# Patient Record
Sex: Female | Born: 1990 | Race: Asian | Hispanic: No | Marital: Single | Smoking: Current every day smoker
Health system: Southern US, Community
[De-identification: ages and names within clinical notes are randomized; demographics above are authoritative.]

## PROBLEM LIST (undated history)

## (undated) ENCOUNTER — Inpatient Hospital Stay (HOSPITAL_COMMUNITY): Payer: Self-pay

---
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
## 2012-01-22 ENCOUNTER — Encounter (HOSPITAL_BASED_OUTPATIENT_CLINIC_OR_DEPARTMENT_OTHER): Payer: Self-pay | Admitting: *Deleted

## 2012-01-22 ENCOUNTER — Emergency Department (HOSPITAL_BASED_OUTPATIENT_CLINIC_OR_DEPARTMENT_OTHER)
Admission: EM | Admit: 2012-01-22 | Discharge: 2012-01-22 | Disposition: A | Discharge status: Home / Self Care | Payer: Self-pay | Attending: Emergency Medicine | Admitting: Emergency Medicine

## 2012-01-22 DIAGNOSIS — IMO0002 Reserved for concepts with insufficient information to code with codable children: Secondary | ICD-10-CM | POA: Insufficient documentation

## 2012-01-22 DIAGNOSIS — K122 Cellulitis and abscess of mouth: Secondary | ICD-10-CM

## 2012-01-22 DIAGNOSIS — R51 Headache: Secondary | ICD-10-CM | POA: Insufficient documentation

## 2012-01-22 DIAGNOSIS — F17200 Nicotine dependence, unspecified, uncomplicated: Secondary | ICD-10-CM | POA: Insufficient documentation

## 2012-01-22 MED ORDER — IBUPROFEN 600 MG PO TABS: 600.0000 mg | ORAL_TABLET | Freq: Four times a day (QID) | ORAL | Status: DC | PRN

## 2012-01-22 MED ORDER — AMOXICILLIN-POT CLAVULANATE 875-125 MG PO TABS: 1.0000 | ORAL_TABLET | Freq: Two times a day (BID) | ORAL | Status: DC

## 2012-01-22 NOTE — ED Notes (Addendum)
Sore throat since Wednesday. Uvula inflamed.

## 2012-01-22 NOTE — ED Notes (Signed)
PA at bedside.

## 2012-01-22 NOTE — ED Provider Notes (Signed)
History     CSN: [id number]  Arrival date & time 01/22/12  1328   First MD Initiated Contact with Patient 01/22/12 1516      Chief Complaint  Patient presents with  . Sore Throat    (Consider location/radiation/quality/duration/timing/severity/associated sxs/prior treatment) HPI Comments: Patient who was recently hospitalized for psychiatric needs presents with a sore throat x 5 days.  She was released from the hospital yesterday. She reports that she has had a sore throat and painful swallowing which has worsened over the past few days.  She reports that she has had adequate PO intake.  She reports no fevers, N/V/D.  She states that her head feels congested and that she has had intermittent headaches.  She has tried gargling hot water and ibuprofen which has not alleviated her symptoms. Onset gradual, course constant with no relief of symptoms.   Patient is a 22 y.o. female presenting with pharyngitis. The history is provided by the patient.  Sore Throat Associated symptoms include congestion, headaches and a sore throat. Pertinent negatives include no abdominal pain, arthralgias, chest pain, chills, coughing, fatigue, fever, myalgias, nausea, neck pain, rash or vomiting.    History reviewed. No pertinent past medical history.  History reviewed. No pertinent past surgical history.  History reviewed. No pertinent family history.  History  Substance Use Topics  . Smoking status: Current Every Day Smoker -- 1.0 packs/day  . Smokeless tobacco: Not on file  . Alcohol Use: No    OB History    Grav Para Term Preterm Abortions TAB SAB Ect Mult Living                  Review of Systems  Constitutional: Positive for appetite change. Negative for fever, chills and fatigue.  HENT: Positive for congestion, sore throat, rhinorrhea and trouble swallowing. Negative for ear pain and neck pain.   Eyes: Negative for pain, discharge and redness.  Respiratory: Negative for cough, shortness  of breath and wheezing.   Cardiovascular: Negative for chest pain.  Gastrointestinal: Negative for nausea, vomiting, abdominal pain and diarrhea.  Genitourinary: Negative for dysuria.  Musculoskeletal: Negative for myalgias and arthralgias.  Skin: Negative for rash.  Neurological: Positive for headaches. Negative for dizziness and light-headedness.    Allergies  Review of patient's allergies indicates no known allergies.  Home Medications   Current Outpatient Rx  Name  Route  Sig  Dispense  Refill  . ALBUTEROL SULFATE HFA 108 (90 BASE) MCG/ACT IN AERS   Inhalation   Inhale 2 puffs into the lungs every 6 (six) hours as needed. For shortness of breath          . AMOXICILLIN 500 MG PO CAPS   Oral   Take 1 capsule (500 mg total) by mouth 3 (three) times daily.   21 capsule   0   . BENZOCAINE-MENTHOL 6-10 MG MT LOZG   Oral   Take 1 lozenge by mouth every 2 (two) hours as needed for pain.   30 lozenge   0   . IBUPROFEN 200 MG PO TABS   Oral   Take 400 mg by mouth once as needed. For sore throat          . PENICILLIN V POTASSIUM PO   Oral   Take 30 mLs by mouth 2 (two) times daily.             BP 130/87  Pulse 90  Temp 98.3 F (36.8 C)  Resp 20  Ht 5'  2" (1.575 m)  Wt 192 lb (87.091 kg)  BMI 35.12 kg/m2  SpO2 99%  LMP 01/04/2012  Physical Exam  Nursing note and vitals reviewed. Constitutional: She is oriented to person, place, and time. She appears well-developed and well-nourished.  HENT:  Head: Normocephalic and atraumatic. No trismus in the jaw.  Right Ear: Tympanic membrane, external ear and ear canal normal.  Left Ear: Tympanic membrane, external ear and ear canal normal.  Nose: Nose normal. No mucosal edema or rhinorrhea.  Mouth/Throat: Uvula is midline, oropharynx is clear and moist and mucous membranes are normal. Mucous membranes are not dry. Uvula swelling present. No oropharyngeal exudate, posterior oropharyngeal edema, posterior oropharyngeal  erythema or tonsillar abscesses.         Uvula erythematous, inflamed, and with exudate.   Eyes: Conjunctivae normal are normal. Pupils are equal, round, and reactive to light. Right eye exhibits no discharge. Left eye exhibits no discharge.  Neck: Normal range of motion. Neck supple.  Cardiovascular: Normal rate, regular rhythm and normal heart sounds.   Pulmonary/Chest: Effort normal and breath sounds normal. No respiratory distress. She has no wheezes. She has no rales.  Abdominal: Soft. Bowel sounds are normal. She exhibits no distension. There is no tenderness.  Lymphadenopathy:    She has cervical adenopathy.  Neurological: She is alert and oriented to person, place, and time.  Skin: Skin is warm and dry. No rash noted.  Psychiatric: She has a normal mood and affect.    ED Course  Procedures (including critical care time)   Labs Reviewed  RAPID STREP SCREEN   No results found.   1. Uvulitis    4:35 PM Patient seen and examined. Strep screen negative.   Vital signs reviewed and are as follows: Filed Vitals:   01/22/12 1335  BP: 130/87  Pulse: 90  Temp: 98.3 F (36.8 C)  Resp: 20   Patient urged to return with high fever, inability to swallow, trouble breathing or neck swelling. Patient verbalizes understanding and agrees with the plan.   MDM  No trismus or evidence of abscesses. Patient moves neck well and do not suspect deep tissue abscess and neck. Patient appears nontoxic. Will treat uvulitis with Augmentin. NSAIDs for pain.        Renne Crigler, [place] 01/22/12 [phone]

## 2012-01-22 NOTE — ED Notes (Signed)
Pt given rx x 2 for ibuprofen and augmentin with instructions for use

## 2012-01-23 NOTE — ED Provider Notes (Signed)
Medical screening examination/treatment/procedure(s) were performed by non-physician practitioner and as supervising physician I was immediately available for consultation/collaboration.   Farrie Sann, MD 01/23/12 0849 

## 2013-04-25 ENCOUNTER — Emergency Department (HOSPITAL_COMMUNITY)
Admission: EM | Admit: 2013-04-25 | Discharge: 2013-04-25 | Disposition: A | Discharge status: Home / Self Care | Payer: Medicaid Other | Attending: Emergency Medicine | Admitting: Emergency Medicine

## 2013-04-25 ENCOUNTER — Encounter (HOSPITAL_COMMUNITY): Payer: Self-pay | Admitting: Emergency Medicine

## 2013-04-25 DIAGNOSIS — F17200 Nicotine dependence, unspecified, uncomplicated: Secondary | ICD-10-CM | POA: Insufficient documentation

## 2013-04-25 DIAGNOSIS — H1130 Conjunctival hemorrhage, unspecified eye: Secondary | ICD-10-CM | POA: Insufficient documentation

## 2013-04-25 DIAGNOSIS — Z79899 Other long term (current) drug therapy: Secondary | ICD-10-CM | POA: Insufficient documentation

## 2013-04-25 DIAGNOSIS — H1132 Conjunctival hemorrhage, left eye: Secondary | ICD-10-CM

## 2013-04-25 DIAGNOSIS — Z792 Long term (current) use of antibiotics: Secondary | ICD-10-CM | POA: Insufficient documentation

## 2013-04-25 MED ORDER — TETRACAINE HCL 0.5 % OP SOLN
1.0000 [drp] | Freq: Once | OPHTHALMIC | Status: DC
  Filled 2013-04-25: qty 2

## 2013-04-25 MED ORDER — FLUORESCEIN-BENOXINATE 0.25-0.4 % OP SOLN: 1.0000 [drp] | Freq: Once | OPHTHALMIC | Status: DC

## 2013-04-25 MED ORDER — FLUORESCEIN SODIUM 1 MG OP STRP
1.0000 | ORAL_STRIP | Freq: Once | OPHTHALMIC | Status: DC
Start: 2013-04-25 — End: 2013-04-25
  Filled 2013-04-25: qty 1

## 2013-04-25 NOTE — ED Provider Notes (Signed)
Medical screening examination/treatment/procedure(s) were performed by non-physician practitioner and as supervising physician I was immediately available for consultation/collaboration.   EKG Interpretation None        Laray AngerKathleen M Geovannie Vilar, DO 04/25/13 1933

## 2013-04-25 NOTE — Discharge Instructions (Signed)
Refer to attached documents for more information.  °
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
## 2013-04-25 NOTE — ED Notes (Signed)
Pt reports left eye is red and she laughed real hard and blood tears came out. This has been for over 1 week. States has been outside a lot. Has clear drainage, reports only foggy vision in the morning.

## 2013-04-25 NOTE — ED Provider Notes (Signed)
CSN: [id number]     Arrival date & time 04/25/13  1109 History  This chart was scribed for non-physician practitioner working with Emilia BeckKaitlyn Kanani Mowbray, Domingo PulseStephen Methviin ED Scribe. This patient was seen in TR04C/TR04C and the patient's care was started at 12:30 PM.    No chief complaint on file.    The history is provided by the patient. No language interpreter was used.    HPI Comments: Synetta Failnita Plagge is a 23 y.o. female who presents to the Emergency Department complaining of red tears that happened while she was laughing, this happened a few days ago. Pt states that her vision had been "foggy" this morning that since resolved. She reports that her left eye is red and that the redness is spreading. She denies any pain.   History reviewed. No pertinent past medical history. History reviewed. No pertinent past surgical history. No family history on file. History  Substance Use Topics  . Smoking status: Current Every Day Smoker -- 1.00 packs/day  . Smokeless tobacco: Not on file  . Alcohol Use: No   OB History   Grav Para Term Preterm Abortions TAB SAB Ect Mult Living                 Review of Systems  Eyes: Positive for discharge and redness. Negative for pain.  All other systems reviewed and are negative.     Allergies  Review of patient's allergies indicates no known allergies.  Home Medications   Prior to Admission medications   Medication Sig Start Date End Date Taking? Authorizing Provider  albuterol (PROVENTIL HFA;VENTOLIN HFA) 108 (90 BASE) MCG/ACT inhaler Inhale 2 puffs into the lungs every 6 (six) hours as needed. For shortness of breath     Historical Provider, MD  amoxicillin (AMOXIL) 500 MG capsule Take 1 capsule (500 mg total) by mouth 3 (three) times daily. 12/04/11   Roxy Horsemanobert Browning, PA-C  amoxicillin-clavulanate (AUGMENTIN) 875-125 MG per tablet Take 1 tablet by mouth every 12 (twelve) hours. 01/22/12   Renne CriglerJoshua Geiple, PA-C  benzocaine-menthol (SORE THROAT LOZENGES)  6-10 MG lozenge Take 1 lozenge by mouth every 2 (two) hours as needed for pain. 10/16/11   Kiril Hippe, PA-C  ibuprofen (ADVIL,MOTRIN) 600 MG tablet Take 1 tablet (600 mg total) by mouth every 6 (six) hours as needed for pain. 01/22/12   Renne CriglerJoshua Geiple, PA-C  PENICILLIN V POTASSIUM PO Take 30 mLs by mouth 2 (two) times daily.      Historical Provider, MD   BP 141/94  Pulse 79  Temp(Src) 98.9 F (37.2 C) (Oral)  Resp 18  SpO2 100%  LMP 04/04/2013 Physical Exam  Nursing note and vitals reviewed. Constitutional: She is oriented to person, place, and time. She appears well-developed and well-nourished.  HENT:  Head: Normocephalic and atraumatic.  Eyes: EOM are normal. Pupils are equal, round, and reactive to light.  Large subconjunctival hemorrhage, noted at lateral left eye. No iris involvment.   Neck: Normal range of motion.  Pulmonary/Chest: Effort normal.  Musculoskeletal: Normal range of motion.  Neurological: She is alert and oriented to person, place, and time.    ED Course  Procedures (including critical care time) DIAGNOSTIC STUDIES: Oxygen Saturation is 100% on RA, normal by my interpretation.    COORDINATION OF CARE:   12:35 PM-Discussed treatment plan which includes left eye will heal on it own with pt at bedside and pt agreed to plan.   Labs Review Labs Reviewed - No data to display  Imaging Review No  results found.   EKG Interpretation None      MDM   Final diagnoses:  Subconjunctival hemorrhage of left eye   I personally performed the services described in this documentation, which was scribed in my presence. The recorded information has been reviewed and is accurate.   Patient has a subconjunctival hemorrhage of left eye. No visual disturbance. Patient reassured this will heal itself. Vitals stable and patient afebrile.     Emilia BeckKaitlyn Nymir Ringler, PA-C 04/25/13 1450

## 2013-05-25 IMAGING — CR DG CHEST 2V
2 series · 2 of 2 positions shown · non-contrast
Comparison: None

CLINICAL DATA: Chest tightness and shortness of breath.

CHEST - 2 VIEW

[w chest pa]
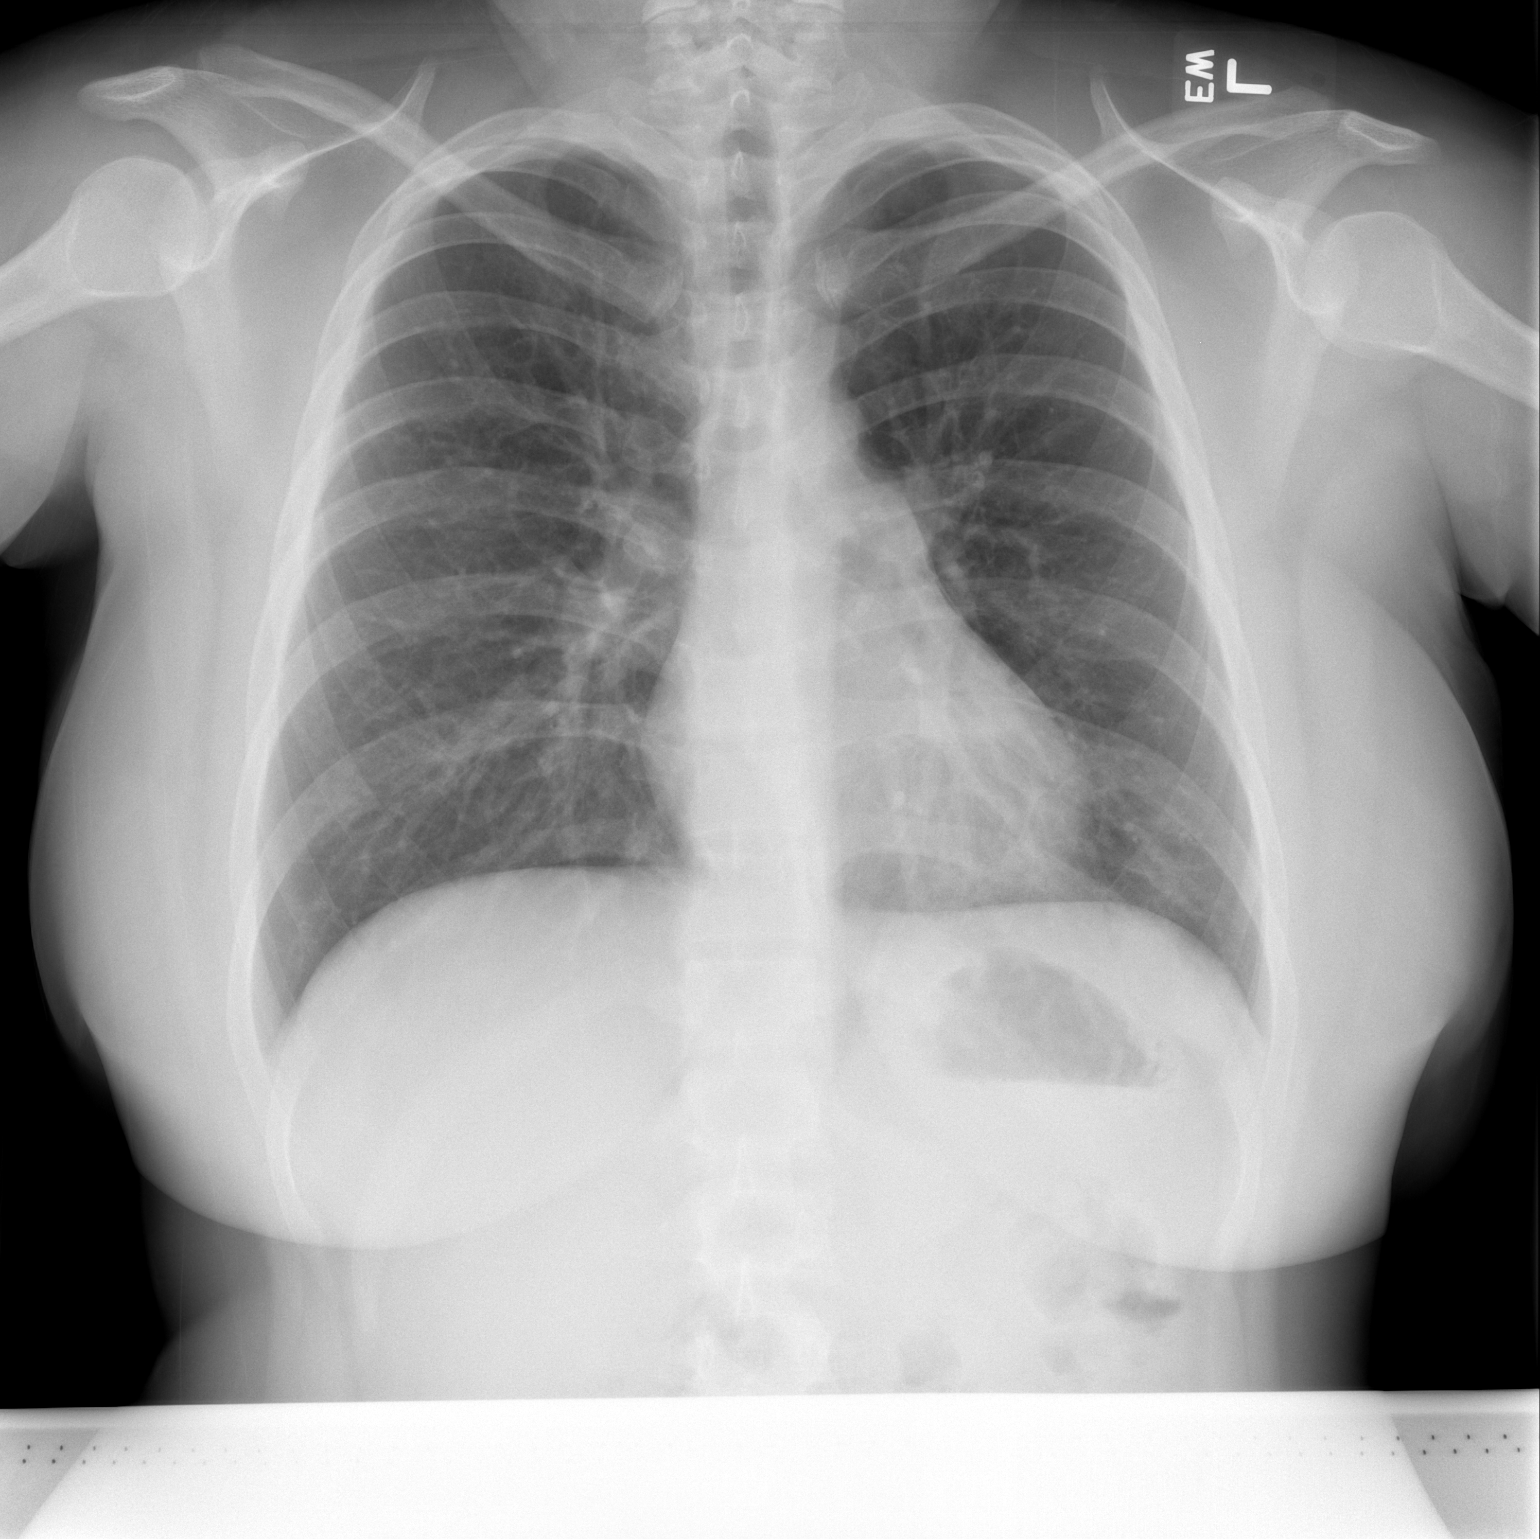

[w chest lat]
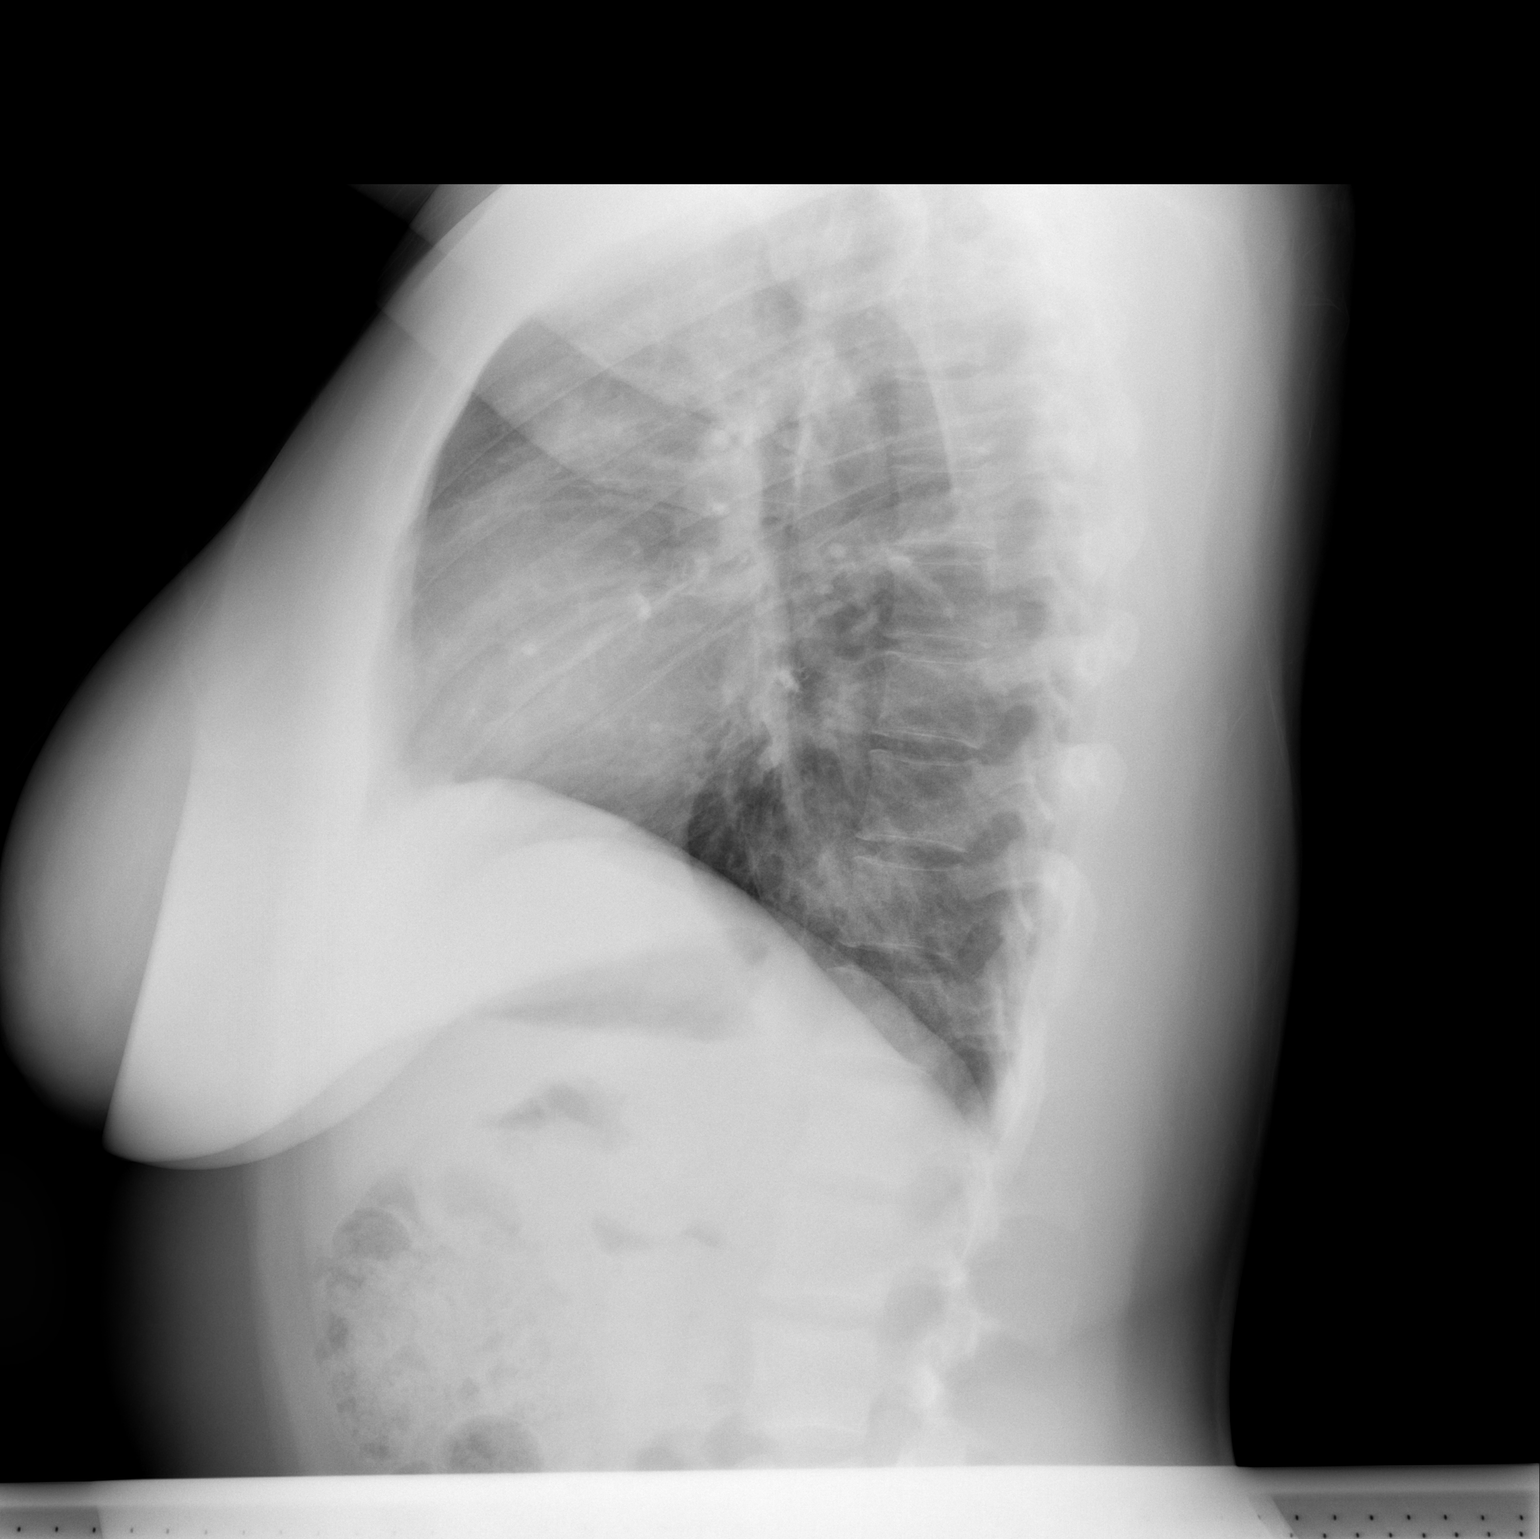

[2 of 2 positions shown; findings below may reference images not displayed]

FINDINGS: The heart size and mediastinal contours are within normal
limits.  Both lungs are clear.  The visualized skeletal structures
are unremarkable.
IMPRESSION: No active disease.

## 2013-09-24 ENCOUNTER — Encounter (HOSPITAL_COMMUNITY): Payer: Self-pay | Admitting: Emergency Medicine

## 2013-09-24 ENCOUNTER — Telehealth (HOSPITAL_BASED_OUTPATIENT_CLINIC_OR_DEPARTMENT_OTHER): Payer: Self-pay | Admitting: Emergency Medicine

## 2013-09-24 ENCOUNTER — Other Ambulatory Visit: Payer: Self-pay | Admitting: Emergency Medicine

## 2013-09-24 ENCOUNTER — Emergency Department (HOSPITAL_COMMUNITY)
Admission: EM | Admit: 2013-09-24 | Discharge: 2013-09-24 | Disposition: A | Discharge status: Home / Self Care | Payer: Medicaid Other | Attending: Emergency Medicine | Admitting: Emergency Medicine

## 2013-09-24 DIAGNOSIS — F17200 Nicotine dependence, unspecified, uncomplicated: Secondary | ICD-10-CM | POA: Insufficient documentation

## 2013-09-24 DIAGNOSIS — N61 Mastitis without abscess: Secondary | ICD-10-CM | POA: Insufficient documentation

## 2013-09-24 DIAGNOSIS — Z792 Long term (current) use of antibiotics: Secondary | ICD-10-CM | POA: Insufficient documentation

## 2013-09-24 DIAGNOSIS — Z79899 Other long term (current) drug therapy: Secondary | ICD-10-CM | POA: Insufficient documentation

## 2013-09-24 DIAGNOSIS — N644 Mastodynia: Secondary | ICD-10-CM

## 2013-09-24 DIAGNOSIS — N63 Unspecified lump in unspecified breast: Secondary | ICD-10-CM

## 2013-09-24 DIAGNOSIS — R234 Changes in skin texture: Secondary | ICD-10-CM

## 2013-09-24 MED ORDER — TRAMADOL HCL 50 MG PO TABS: 50.0000 mg | ORAL_TABLET | Freq: Four times a day (QID) | ORAL | Status: DC | PRN

## 2013-09-24 MED ORDER — CEPHALEXIN 500 MG PO CAPS: 500.0000 mg | ORAL_CAPSULE | Freq: Four times a day (QID) | ORAL | Status: DC

## 2013-09-24 MED ORDER — SULFAMETHOXAZOLE-TRIMETHOPRIM 800-160 MG PO TABS: 1.0000 | ORAL_TABLET | Freq: Two times a day (BID) | ORAL | Status: DC

## 2013-09-24 NOTE — ED Provider Notes (Signed)
CSN: [id number]     Arrival date & time 09/24/13  0903 History  This chart was scribed for non-physician practitioner, Marlon Pel, PA-C working with Audree Camel, MD by Greggory Stallion, ED scribe. This patient was seen in room TR08C/TR08C and the patient's care was started at 10:27 AM.   Chief Complaint  Patient presents with  . Breast Pain   The history is provided by the patient. No language interpreter was used.   HPI Comments: Carolyn Barajas is a 23 y.o. female who presents to the Emergency Department complaining of worsening right breast pain that started about one month ago. Palpation worsens pain. States she has a bump to her breast that is getting larger and more red. Reports some crusting around her nipple. Reports getting her nipple pierced in January. Denies fever, emesis, nipple pain.   History reviewed. No pertinent past medical history. History reviewed. No pertinent past surgical history. No family history on file. History  Substance Use Topics  . Smoking status: Current Every Day Smoker -- 1.00 packs/day  . Smokeless tobacco: Not on file  . Alcohol Use: No   OB History   Grav Para Term Preterm Abortions TAB SAB Ect Mult Living                 Review of Systems  Constitutional: Negative for fever.  Gastrointestinal: Negative for vomiting.  Musculoskeletal:       Breast pain  Skin: Positive for color change.  All other systems reviewed and are negative.  Allergies  Review of patient's allergies indicates no known allergies.  Home Medications   Prior to Admission medications   Medication Sig Start Date End Date Taking? Authorizing Provider  albuterol (PROVENTIL HFA;VENTOLIN HFA) 108 (90 BASE) MCG/ACT inhaler Inhale 2 puffs into the lungs every 6 (six) hours as needed. For shortness of breath     Historical Provider, MD  amoxicillin (AMOXIL) 500 MG capsule Take 1 capsule (500 mg total) by mouth 3 (three) times daily. 12/04/11   Roxy Horseman, PA-C   amoxicillin-clavulanate (AUGMENTIN) 875-125 MG per tablet Take 1 tablet by mouth every 12 (twelve) hours. 01/22/12   Renne Crigler, PA-C  benzocaine-menthol (SORE THROAT LOZENGES) 6-10 MG lozenge Take 1 lozenge by mouth every 2 (two) hours as needed for pain. 10/16/11   Kaitlyn Szekalski, PA-C  cephALEXin (KEFLEX) 500 MG capsule Take 1 capsule (500 mg total) by mouth 4 (four) times daily. 09/24/13   Nayquan Evinger Irine Seal, PA-C  ibuprofen (ADVIL,MOTRIN) 600 MG tablet Take 1 tablet (600 mg total) by mouth every 6 (six) hours as needed for pain. 01/22/12   Renne Crigler, PA-C  PENICILLIN V POTASSIUM PO Take 30 mLs by mouth 2 (two) times daily.      Historical Provider, MD  sulfamethoxazole-trimethoprim (SEPTRA DS) 800-160 MG per tablet Take 1 tablet by mouth 2 (two) times daily. 09/24/13   Lemarcus Baggerly Irine Seal, PA-C  traMADol (ULTRAM) 50 MG tablet Take 1 tablet (50 mg total) by mouth every 6 (six) hours as needed. 09/24/13   Cheila Wickstrom Irine Seal, PA-C   BP 117/60  Pulse 84  Temp(Src) 98.9 F (37.2 C) (Oral)  Resp 16  SpO2 99%  Physical Exam  Nursing note and vitals reviewed. Constitutional: She is oriented to person, place, and time. She appears well-developed and well-nourished. No distress.  HENT:  Head: Normocephalic and atraumatic.  Eyes: Conjunctivae and EOM are normal.  Neck: Neck supple. No tracheal deviation present.  Cardiovascular: Normal rate.   Pulmonary/Chest: Effort  normal. No respiratory distress.    Musculoskeletal: Normal range of motion.  Neurological: She is alert and oriented to person, place, and time.  Skin: Skin is warm and dry.  Psychiatric: She has a normal mood and affect. Her behavior is normal.    ED Course  Procedures (including critical care time)  DIAGNOSTIC STUDIES: Oxygen Saturation is 99% on RA, normal by my interpretation.    COORDINATION OF CARE: 10:31 AM-Discussed treatment plan which includes pain medication and an antibiotic with pt at bedside and pt agreed  to plan. Will give pt a referral to the Breast Center and advised her to follow up for ultrasound.  Labs Review Labs Reviewed - No data to display  Imaging Review No results found.   EKG Interpretation None      MDM   Final diagnoses:  Breast infection    Most likely mastoiditis but concern for possible abscess due to pain. Spoke with the breast center, order placed for ultrasound or right breast. Pt has to call as soon as she is discharged out of ER system and she can have imaging done today if she calls to schedule appointment. Pt agreeable, has the day off work and transportation to the breast center. She says she will go.  sulfamethoxazole-trimethoprim (SEPTRA DS) 800-160 MG per tablet Take 1 tablet by mouth 2 (two) times daily. 28 tablet Lawsyn Heiler Irine Seal, PA-C  traMADol (ULTRAM) 50 MG tablet Take 1 tablet (50 mg total) by mouth every 6 (six) hours as needed. 15 tablet Adline Kirshenbaum Irine Seal, PA-C cephALEXin (KEFLEX) 500 MG capsule Take 1 capsule (500 mg total) by mouth 4 (four) times daily. 40 capsule Dorthula Matas, PA-C  23 y.o.Carolyn Barajas's evaluation in the Emergency Department is complete. It has been determined that no acute conditions requiring further emergency intervention are present at this time. The patient/guardian have been advised of the diagnosis and plan. We have discussed signs and symptoms that warrant return to the ED, such as changes or worsening in symptoms.  Vital signs are stable at discharge. Filed Vitals:   09/24/13 0911  BP: 117/60  Pulse: 84  Temp: 98.9 F (37.2 C)  Resp: 16    Patient/guardian has voiced understanding and agreed to follow-up with the PCP or specialist.  I personally performed the services described in this documentation, which was scribed in my presence. The recorded information has been reviewed and is accurate.  Dorthula Matas, PA-C 09/24/13 1047

## 2013-09-24 NOTE — ED Notes (Signed)
Rt breast pain and lump x 1 month has, had nipple pierced in jan  Area is red  Tender touch

## 2013-09-24 NOTE — Discharge Instructions (Signed)
Mastitis Mastitis is inflammation of the breast tissue. It occurs most often in women who are breastfeeding, but it can also affect other women, and even sometimes men. CAUSES  Mastitis is usually caused by a bacterial infection. Bacteria enter the breast tissue through cuts or openings in the skin. Typically, this occurs with breastfeeding because of cracked or irritated skin. Sometimes, it can occur even when there is no opening in the skin. It can be associated with plugged milk (lactiferous) ducts. Nipple piercing can also lead to mastitis. Also, some forms of breast cancer can cause mastitis. SIGNS AND SYMPTOMS   Swelling, redness, tenderness, and pain in an area of the breast.  Swelling of the glands under the arm on the same side.  Fever. If an infection is allowed to progress, a collection of pus (abscess) may develop. DIAGNOSIS  Your health care provider can usually diagnose mastitis based on your symptoms and a physical exam. Tests may be done to help confirm the diagnosis. These may include:   Removal of pus from the breast by applying pressure to the area. This pus can be examined in the lab to determine which bacteria are present. If an abscess has developed, the fluid in the abscess can be removed with a needle. This can also be used to confirm the diagnosis and determine the bacteria present. In most cases, pus will not be present.  Blood tests to determine if your body is fighting a bacterial infection.  Mammogram or ultrasound tests to rule out other problems or diseases. TREATMENT  Antibiotic medicine is used to treat a bacterial infection. Your health care provider will determine which bacteria are most likely causing the infection and will select an appropriate antibiotic. This is sometimes changed based on the results of tests performed to identify the bacteria, or if there is no response to the antibiotic selected. Antibiotics are usually given by mouth. You may also be  given medicine for pain. Mastitis that occurs with breastfeeding will sometimes go away on its own, so your health care provider may choose to wait 24 hours after first seeing you to decide whether a prescription medicine is needed. HOME CARE INSTRUCTIONS   Only take over-the-counter or prescription medicines for pain, fever, or discomfort as directed by your health care provider.  If your health care provider prescribed an antibiotic, take the medicine as directed. Make sure you finish it even if you start to feel better.  Do not wear a tight or underwire bra. Wear a soft, supportive bra.  Increase your fluid intake, especially if you have a fever.  Women who are breastfeeding should follow these instructions:  Continue to empty the breast. Your health care provider can tell you whether this milk is safe for your infant or needs to be thrown out. You may be told to stop nursing until your health care provider thinks it is safe for your baby. Use a breast pump if you are advised to stop nursing.  Keep your nipples clean and dry.  Empty the first breast completely before going to the other breast. If your baby is not emptying your breasts completely for some reason, use a breast pump to empty your breasts.  If you go back to work, pump your breasts while at work to stay in time with your nursing schedule.  Avoid allowing your breasts to become overly filled with milk (engorged). SEEK MEDICAL CARE IF:   You have pus-like discharge from the breast.  Your symptoms do not   improve with the treatment prescribed by your health care provider within 2 days. SEEK IMMEDIATE MEDICAL CARE IF:   Your pain and swelling are getting worse.  You have pain that is not controlled with medicine.  You have a red line extending from the breast toward your armpit.  You have a fever or persistent symptoms for more Koskinen 2-3 days.  You have a fever and your symptoms suddenly get worse. Document Released:  12/21/2004 Document Revised: 12/26/2012 Document Reviewed: 07/21/2012 ExitCare Patient Information 2015 [employer]. This information is not intended to replace advice given to you by your health care provider. Make sure you discuss any questions you have with your health care provider.  

## 2013-09-25 NOTE — ED Provider Notes (Signed)
Medical screening examination/treatment/procedure(s) were performed by non-physician practitioner and as supervising physician I was immediately available for consultation/collaboration.     Suzi Roots, MD 09/25/13 1409

## 2013-09-27 ENCOUNTER — Other Ambulatory Visit (HOSPITAL_COMMUNITY): Payer: Self-pay | Admitting: *Deleted

## 2013-09-27 DIAGNOSIS — N6310 Unspecified lump in the right breast, unspecified quadrant: Secondary | ICD-10-CM

## 2013-10-10 ENCOUNTER — Other Ambulatory Visit: Payer: Self-pay | Admitting: Obstetrics and Gynecology

## 2013-10-16 ENCOUNTER — Ambulatory Visit (HOSPITAL_COMMUNITY): Payer: Medicaid Other

## 2013-10-16 ENCOUNTER — Other Ambulatory Visit: Payer: Medicaid Other

## 2013-10-17 ENCOUNTER — Other Ambulatory Visit: Payer: Medicaid Other

## 2013-11-28 ENCOUNTER — Encounter (HOSPITAL_COMMUNITY): Payer: Self-pay

## 2013-11-28 ENCOUNTER — Ambulatory Visit
Admission: RE | Admit: 2013-11-28 | Discharge: 2013-11-28 | Disposition: A | Discharge status: Home / Self Care | Payer: No Typology Code available for payment source | Source: Ambulatory Visit | Attending: Obstetrics and Gynecology | Admitting: Obstetrics and Gynecology

## 2013-11-28 ENCOUNTER — Ambulatory Visit (HOSPITAL_COMMUNITY)
Admission: RE | Admit: 2013-11-28 | Discharge: 2013-11-28 | Disposition: A | Discharge status: Home / Self Care | Payer: Medicaid Other | Source: Ambulatory Visit | Attending: Obstetrics and Gynecology | Admitting: Obstetrics and Gynecology

## 2013-11-28 VITALS — BP 110/64 | Ht 62.0 in | Wt 194.0 lb

## 2013-11-28 DIAGNOSIS — N6310 Unspecified lump in the right breast, unspecified quadrant: Secondary | ICD-10-CM

## 2013-11-28 DIAGNOSIS — Z1239 Encounter for other screening for malignant neoplasm of breast: Secondary | ICD-10-CM

## 2013-11-28 NOTE — Patient Instructions (Signed)
Explained Carolyn Barajas that she needs a Pap smear. Patient is on her menstrual cycle and has rescheduled Pap smear for January 10, 2013.  [facility]. Appointment scheduled for Wednesday, November 28, 2013 at 1300. Patient aware of appointment and will be there. Smoking cessation discussed with patient and resources given.  Carolyn Barajas verbalized understanding.  Imanol Bihl, Kathaleen Maserhristine Poll, RN 11:18 AM

## 2013-11-28 NOTE — Progress Notes (Signed)
Complaints of right breast lump x 2 months.   Pap Smear:  Pap smear not completed today. Last Pap smear was over 3 years ago in [facility] and normal per patient. Per patient has no history of an abnormal Pap smear. Patient is currently on menstrual cycle and has rescheduled Pap smear for Thursday, January 10, 2013 [facility]. No pap smear results in EPIC.   Physical exam: Breasts Breasts symmetrical. No skin abnormalities left breast. Two bruises observed on right breast on the right upper outer quadrant and the lower inner quadrant. No nipple retraction bilateral breasts. No nipple discharge bilateral breasts. No lymphadenopathy. No lumps palpated bilateral breasts. Unable to palpate lump in area of concern within the right breast. Patient stated was given antibiotics and is unable to palpate lump. No complaints of pain or tenderness on exam. Patient escorted to mammography for a screening mammogram. [facility] right breast ultrasound. Appointment scheduled for Wednesday, November 28, 2013 at 1300.          Pelvic/Bimanual No Pap smear completed today since patient is currently on menstrual period. Patient has rescheduled Pap smear.   Smoking cessation discussed with patient. Referred patient to the [facility] and gave information on the free smoking cessation classes offered at the [facility].

## 2014-01-10 ENCOUNTER — Encounter (HOSPITAL_COMMUNITY): Payer: Self-pay

## 2014-01-10 ENCOUNTER — Ambulatory Visit (HOSPITAL_COMMUNITY)
Admission: RE | Admit: 2014-01-10 | Discharge: 2014-01-10 | Disposition: A | Discharge status: Home / Self Care | Payer: Self-pay | Source: Ambulatory Visit | Attending: Obstetrics and Gynecology | Admitting: Obstetrics and Gynecology

## 2014-01-10 VITALS — BP 112/68 | Temp 99.1°F | Ht 62.0 in | Wt 193.4 lb

## 2014-01-10 DIAGNOSIS — Z01419 Encounter for gynecological examination (general) (routine) without abnormal findings: Secondary | ICD-10-CM

## 2014-01-10 NOTE — Progress Notes (Signed)
No complaints today.  Pap Smear:  Pap smear completed today. [facility] and normal per patient. Per patient has no history of an abnormal Pap smear. No pap smear results in EPIC.      Pelvic/Bimanual   Ext Genitalia No lesions, no swelling and no discharge observed on external genitalia.         Vagina Vagina pink and normal texture. No lesions or discharge observed in vagina.          Cervix Cervix is present. Cervix pink and of normal texture. No discharge observed.     Uterus Uterus is present and palpable. Uterus in normal position and normal size.        Adnexae Bilateral ovaries present and palpable. No tenderness on palpation.          Rectovaginal No rectal exam completed today since patient had no rectal complaints. No skin abnormalities observed on exam.      Smoking cessation discussed with patient. Ref[facility] the [facility]. Patient stated she is not read to quit at this time.

## 2014-01-10 NOTE — Patient Instructions (Signed)
Explained to  Carolyn Barajas that [facility] will cover Pap smears every 3 years unless has a history of abnormal Pap smears. Let patient know will follow up with her within the next couple weeks with results of Pap smear by phone. Smoking cessation discussed with patient and resources given. Carolyn Failnita Mcclory verbalized understanding.  Aero Drummonds, Kathaleen Maserhristine Poll, RN 2:16 PM

## 2014-01-14 ENCOUNTER — Telehealth (HOSPITAL_COMMUNITY): Payer: Self-pay | Admitting: *Deleted

## 2014-01-14 LAB — CYTOLOGY - PAP

## 2014-01-14 NOTE — Telephone Encounter (Signed)
Telephoned patient at home # and discussed negative pap smear results. Also negative HPV results. Next pap smear due in 3 years. Patient voiced understanding.

## 2014-10-08 ENCOUNTER — Emergency Department (HOSPITAL_BASED_OUTPATIENT_CLINIC_OR_DEPARTMENT_OTHER): Payer: Self-pay

## 2014-10-08 ENCOUNTER — Emergency Department (HOSPITAL_BASED_OUTPATIENT_CLINIC_OR_DEPARTMENT_OTHER)
Admission: EM | Admit: 2014-10-08 | Discharge: 2014-10-08 | Disposition: A | Discharge status: Home / Self Care | Payer: Self-pay | Attending: Emergency Medicine | Admitting: Emergency Medicine

## 2014-10-08 ENCOUNTER — Encounter (HOSPITAL_BASED_OUTPATIENT_CLINIC_OR_DEPARTMENT_OTHER): Payer: Self-pay | Admitting: Emergency Medicine

## 2014-10-08 DIAGNOSIS — Z3A00 Weeks of gestation of pregnancy not specified: Secondary | ICD-10-CM | POA: Insufficient documentation

## 2014-10-08 DIAGNOSIS — O99280 Endocrine, nutritional and metabolic diseases complicating pregnancy, unspecified trimester: Secondary | ICD-10-CM | POA: Insufficient documentation

## 2014-10-08 DIAGNOSIS — O034 Incomplete spontaneous abortion without complication: Secondary | ICD-10-CM | POA: Insufficient documentation

## 2014-10-08 DIAGNOSIS — Z792 Long term (current) use of antibiotics: Secondary | ICD-10-CM | POA: Insufficient documentation

## 2014-10-08 DIAGNOSIS — O26899 Other specified pregnancy related conditions, unspecified trimester: Secondary | ICD-10-CM

## 2014-10-08 DIAGNOSIS — O99330 Smoking (tobacco) complicating pregnancy, unspecified trimester: Secondary | ICD-10-CM | POA: Insufficient documentation

## 2014-10-08 DIAGNOSIS — O4690 Antepartum hemorrhage, unspecified, unspecified trimester: Secondary | ICD-10-CM

## 2014-10-08 DIAGNOSIS — E669 Obesity, unspecified: Secondary | ICD-10-CM | POA: Insufficient documentation

## 2014-10-08 DIAGNOSIS — R102 Pelvic and perineal pain: Secondary | ICD-10-CM

## 2014-10-08 DIAGNOSIS — Z79899 Other long term (current) drug therapy: Secondary | ICD-10-CM | POA: Insufficient documentation

## 2014-10-08 DIAGNOSIS — F17210 Nicotine dependence, cigarettes, uncomplicated: Secondary | ICD-10-CM | POA: Insufficient documentation

## 2014-10-08 LAB — CBC WITH DIFFERENTIAL/PLATELET
BASOS PCT: 0 %
Basophils Absolute: 0 10*3/uL (ref 0.0–0.1)
EOS ABS: 0.1 10*3/uL (ref 0.0–0.7)
Eosinophils Relative: 1 %
HCT: 43.9 % (ref 36.0–46.0)
HEMOGLOBIN: 14.5 g/dL (ref 12.0–15.0)
LYMPHS ABS: 2.8 10*3/uL (ref 0.7–4.0)
Lymphocytes Relative: 24 %
MCH: 31.3 pg (ref 26.0–34.0)
MCHC: 33 g/dL (ref 30.0–36.0)
MCV: 94.6 fL (ref 78.0–100.0)
MONO ABS: 0.8 10*3/uL (ref 0.1–1.0)
MONOS PCT: 7 %
NEUTROS ABS: 8 10*3/uL — AB (ref 1.7–7.7)
NEUTROS PCT: 68 %
Platelets: 346 10*3/uL (ref 150–400)
RBC: 4.64 MIL/uL (ref 3.87–5.11)
RDW: 12.8 % (ref 11.5–15.5)
WBC: 11.7 10*3/uL — ABNORMAL HIGH (ref 4.0–10.5)

## 2014-10-08 LAB — URINALYSIS, ROUTINE W REFLEX MICROSCOPIC
Bilirubin Urine: NEGATIVE
GLUCOSE, UA: NEGATIVE mg/dL
KETONES UR: NEGATIVE mg/dL
Nitrite: NEGATIVE
PROTEIN: NEGATIVE mg/dL
Specific Gravity, Urine: 1.019 (ref 1.005–1.030)
Urobilinogen, UA: 1 mg/dL (ref 0.0–1.0)
pH: 7 (ref 5.0–8.0)

## 2014-10-08 LAB — URINE MICROSCOPIC-ADD ON

## 2014-10-08 LAB — ABO/RH: ABO/RH(D): A POS

## 2014-10-08 LAB — WET PREP, GENITAL
Trich, Wet Prep: NONE SEEN
YEAST WET PREP: NONE SEEN

## 2014-10-08 LAB — PREGNANCY, URINE: Preg Test, Ur: POSITIVE — AB

## 2014-10-08 LAB — HCG, QUANTITATIVE, PREGNANCY: hCG, Beta Chain, Quant, S: 63 m[IU]/mL — ABNORMAL HIGH (ref <5)

## 2014-10-08 MED ORDER — ACETAMINOPHEN 500 MG PO TABS: 500.0000 mg | ORAL_TABLET | Freq: Four times a day (QID) | ORAL | Status: DC | PRN

## 2014-10-08 MED ORDER — ACETAMINOPHEN 325 MG PO TABS
650.0000 mg | ORAL_TABLET | Freq: Once | ORAL | Status: AC
  Administered 2014-10-08: 650 mg via ORAL
  Filled 2014-10-08: qty 2

## 2014-10-08 NOTE — ED Notes (Signed)
Patient reports that she has had a period for the last month. The patient reports that her right pelvic area and down into her right leg hurts especial after long stretches of work where she stands a lot on her feet

## 2014-10-08 NOTE — ED Provider Notes (Signed)
CSN: [id number]     Arrival date & time 10/08/14  1351 History   First MD Initiated Contact with Patient 10/08/14 1402     Chief Complaint  Patient presents with  . Pelvic Pain     (Consider location/radiation/quality/duration/timing/severity/associated sxs/prior Treatment) HPI   24 year old female who presents for evaluation of pelvic pain. Patient states she has never been pregnant before. For the past month she has had recurrent vaginal spotting and vaginal bleeding. Describes small amount of vaginal spotting which has since increased for the past 2-3 days. She also complaining of pain to her right pelvic region in which she described as a dull achy sensation waxing waning worsening with standing, occasionally radiates down to her right leg and improves with rest. She also report feeling fatigue. The pelvic pain has been increasing in severity within the past 2-3 days. This is the first day that she has off from work to be seen.  History reviewed. No pertinent past medical history. History reviewed. No pertinent past surgical history. Family History  Problem Relation Age of Onset  . Breast cancer Mother   . Cancer Mother     lung   Social History  Substance Use Topics  . Smoking status: Current Every Day Smoker -- 1.00 packs/day  . Smokeless tobacco: None  . Alcohol Use: 4.2 oz/week    7 Cans of beer per week   OB History    Gravida Para Term Preterm AB TAB SAB Ectopic Multiple Living       Review of Systems  All other systems reviewed and are negative.     Allergies  Review of patient's allergies indicates no known allergies.  Home Medications   Prior to Admission medications   Medication Sig Start Date End Date Taking? Authorizing Provider  albuterol (PROVENTIL HFA;VENTOLIN HFA) 108 (90 BASE) MCG/ACT inhaler Inhale 2 puffs into the lungs every 6 (six) hours as needed. For shortness of breath     Historical Provider, MD  amoxicillin (AMOXIL) 500  MG capsule Take 1 capsule (500 mg total) by mouth 3 (three) times daily. Patient not taking: Reported on 11/28/2013 12/04/11   Roxy Horseman, PA-C  amoxicillin-clavulanate (AUGMENTIN) 875-125 MG per tablet Take 1 tablet by mouth every 12 (twelve) hours. Patient not taking: Reported on 11/28/2013 01/22/12   Renne Crigler, PA-C  benzocaine-menthol (SORE THROAT LOZENGES) 6-10 MG lozenge Take 1 lozenge by mouth every 2 (two) hours as needed for pain. Patient not taking: Reported on 11/28/2013 10/16/11   Emilia Beck, PA-C  cephALEXin (KEFLEX) 500 MG capsule Take 1 capsule (500 mg total) by mouth 4 (four) times daily. Patient not taking: Reported on 11/28/2013 09/24/13   Marlon Pel, PA-C  ibuprofen (ADVIL,MOTRIN) 600 MG tablet Take 1 tablet (600 mg total) by mouth every 6 (six) hours as needed for pain. Patient not taking: Reported on 11/28/2013 01/22/12   Renne Crigler, PA-C  PENICILLIN V POTASSIUM PO Take 30 mLs by mouth 2 (two) times daily.      Historical Provider, MD  sulfamethoxazole-trimethoprim (SEPTRA DS) 800-160 MG per tablet Take 1 tablet by mouth 2 (two) times daily. Patient not taking: Reported on 11/28/2013 09/24/13   Marlon Pel, PA-C  traMADol (ULTRAM) 50 MG tablet Take 1 tablet (50 mg total) by mouth every 6 (six) hours as needed. Patient not taking: Reported on 11/28/2013 09/24/13   Marlon Pel, PA-C   BP 130/91 mmHg  Pulse 94  Temp(Src) 98.2 F (  36.8 C) (Oral)  Resp 16  Ht 5\' 2"  (1.575 m)  Wt 180 lb (81.647 kg)  BMI 32.91 kg/m2  SpO2 100%  LMP 10/08/2014 Physical Exam  Constitutional: She appears well-developed and well-nourished. No distress.  Obese Asian female appears to be in no acute distress.  HENT:  Head: Atraumatic.  Eyes: Conjunctivae are normal.  Neck: Neck supple.  Cardiovascular: Normal rate and regular rhythm.   Pulmonary/Chest: Effort normal and breath sounds normal.  Abdominal: Soft. Bowel sounds are normal. She exhibits no distension. There  is tenderness (Mild tenderness to right lower abdomen right groin region on palpation without guarding or rebound tenderness. No pain at McBurney's point. Negative Murphy sign.).  Genitourinary:  Chaperone present during exam. No inguinal lymphadenopathy or inguinal hernia noted. Normal external genitalia. No pain with speculum insertion. Normal vaginal vault, with trace of blood in the vault. Close cervical os without any lesion or rash. On bimanual examination right adnexal tenderness on palpation without cervical motion tenderness.  Neurological: She is alert.  Skin: No rash noted.  Psychiatric: She has a normal mood and affect.  Nursing note and vitals reviewed.   ED Course  Procedures (including critical care time)  Patient presents with persistent vaginal bleeding vaginal spotting for the past month as well as right pelvic pain for the past 2-3 days. Her pregnancy test is positive therefore this is concerning for threatened miscarriage or possible incomplete/complete abortion. No prior pregnancy according to patient. Will need transvaginal ultrasound to rule out ectopic pregnancy.  4:54 PM Patients Quant is 63. Her pregnancy test is positive. Her transvaginal ultrasound demonstrate a complex hypoechoic and hyperechoic structure in the right ovary measuring 3 cm with small amount of adjacent free fluid. Cannot completely exclude right ectopic pregnancy. I appreciate consultation from on-call obstetrician, Dr. Debroah Loop in which I discussed the finding. At this time he recommends patient follow-up in 48 hours at the [facility] for a recheck.  Pt otherwise stable, comfortable and agrees to f/u.  Pt understand to return if her sxs worsen.  Suspect complete abortion at this time.  Care discussed with Dr. Madilyn Hook.  Labs Review Labs Reviewed  WET PREP, GENITAL - Abnormal; Notable for the following:    Clue Cells Wet Prep HPF POC FEW (*)    WBC, Wet Prep HPF POC FEW (*)    All other components within normal  limits  URINALYSIS, ROUTINE W REFLEX MICROSCOPIC (NOT AT [facility]) - Abnormal; Notable for the following:    Hgb urine dipstick MODERATE (*)    Leukocytes, UA SMALL (*)    All other components within normal limits  PREGNANCY, URINE - Abnormal; Notable for the following:    Preg Test, Ur POSITIVE (*)    All other components within normal limits  CBC WITH DIFFERENTIAL/PLATELET - Abnormal; Notable for the following:    WBC 11.7 (*)    Neutro Abs 8.0 (*)    All other components within normal limits  HCG, QUANTITATIVE, PREGNANCY - Abnormal; Notable for the following:    hCG, Beta Chain, Quant, S 63 (*)    All other components within normal limits  URINE MICROSCOPIC-ADD ON  RPR  HIV ANTIBODY (ROUTINE TESTING)  ABO/RH  GC/CHLAMYDIA PROBE AMP (Central) NOT AT [facility]    Imaging Review No results found. I have personally reviewed and evaluated these images and lab results as part of my medical decision-making.   EKG Interpretation None      MDM   Final diagnoses:  Incomplete  abortion  Vaginal bleeding in pregnancy, unspecified trimester    BP 130/91 mmHg  Pulse 94  Temp(Src) 98.2 F (36.8 C) (Oral)  Resp 16  Ht  (1.575 m)  Wt 180 lb (81.647 kg)  BMI 32.91 kg/m2  SpO2 100%  LMP 09/05/2014     Fayrene Helper, PA-C 10/08/14 1756  Tilden Fossa, MD 10/08/14 1806

## 2014-10-08 NOTE — Discharge Instructions (Signed)
Your symptoms are likely related to a miscarriage. Follow up at [facility] (MAU) in 48 hrs for a recheck.  Take tylenol as needed for pain.    Miscarriage A miscarriage is the sudden loss of an unborn baby (fetus) before the 20th week of pregnancy. Most miscarriages happen in the first 3 months of pregnancy. Sometimes, it happens before a woman even knows she is pregnant. A miscarriage is also called a "spontaneous miscarriage" or "early pregnancy loss." Having a miscarriage can be an emotional experience. Talk with your caregiver about any questions you may have about miscarrying, the grieving process, and your future pregnancy plans. CAUSES   Problems with the fetal chromosomes that make it impossible for the baby to develop normally. Problems with the baby's genes or chromosomes are most often the result of errors that occur, by chance, as the embryo divides and grows. The problems are not inherited from the parents.  Infection of the cervix or uterus.   Hormone problems.   Problems with the cervix, such as having an incompetent cervix. This is when the tissue in the cervix is not strong enough to hold the pregnancy.   Problems with the uterus, such as an abnormally shaped uterus, uterine fibroids, or congenital abnormalities.   Certain medical conditions.   Smoking, drinking alcohol, or taking illegal drugs.   Trauma.  Often, the cause of a miscarriage is unknown.  SYMPTOMS   Vaginal bleeding or spotting, with or without cramps or pain.  Pain or cramping in the abdomen or lower back.  Passing fluid, tissue, or blood clots from the vagina. DIAGNOSIS  Your caregiver will perform a physical exam. You may also have an ultrasound to confirm the miscarriage. Blood or urine tests may also be ordered. TREATMENT   Sometimes, treatment is not necessary if you naturally pass all the fetal tissue that was in the uterus. If some of the fetus or placenta remains in the body  (incomplete miscarriage), tissue left behind may become infected and must be removed. Usually, a dilation and curettage (D and C) procedure is performed. During a D and C procedure, the cervix is widened (dilated) and any remaining fetal or placental tissue is gently removed from the uterus.  Antibiotic medicines are prescribed if there is an infection. Other medicines may be given to reduce the size of the uterus (contract) if there is a lot of bleeding.  If you have Rh negative blood and your baby was Rh positive, you will need a Rh immunoglobulin shot. This shot will protect any future baby from having Rh blood problems in future pregnancies. HOME CARE INSTRUCTIONS   Your caregiver may order bed rest or may allow you to continue light activity. Resume activity as directed by your caregiver.  Have someone help with home and family responsibilities during this time.   Keep track of the number of sanitary pads you use each day and how soaked (saturated) they are. Write down this information.   Do not use tampons. Do not douche or have sexual intercourse until approved by your caregiver.   Only take over-the-counter or prescription medicines for pain or discomfort as directed by your caregiver.   Do not take aspirin. Aspirin can cause bleeding.   Keep all follow-up appointments with your caregiver.   If you or your partner have problems with grieving, talk to your caregiver or seek counseling to help cope with the pregnancy loss. Allow enough time to grieve before trying to get pregnant again.  SEEK IMMEDIATE MEDICAL CARE IF:   You have severe cramps or pain in your back or abdomen.  You have a fever.  You pass large blood clots (walnut-sized or larger) ortissue from your vagina. Save any tissue for your caregiver to inspect.   Your bleeding increases.   You have a thick, bad-smelling vaginal discharge.  You become lightheaded, weak, or you faint.   You have chills.   MAKE SURE YOU:  Understand these instructions.  Will watch your condition.  Will get help right away if you are not doing well or get worse.   This information is not intended to replace advice given to you by your health care provider. Make sure you discuss any questions you have with your health care provider.   Document Released: 06/16/2000 Document Revised: 04/17/2012 Document Reviewed: 02/09/2011 Elsevier Interactive Patient Education [employer]

## 2014-10-09 LAB — GC/CHLAMYDIA PROBE AMP (~~LOC~~) NOT AT ARMC
CHLAMYDIA, DNA PROBE: NEGATIVE
NEISSERIA GONORRHEA: NEGATIVE

## 2014-10-09 LAB — HIV ANTIBODY (ROUTINE TESTING W REFLEX): HIV SCREEN 4TH GENERATION: NONREACTIVE

## 2014-10-09 LAB — RPR: RPR: NONREACTIVE

## 2014-10-10 ENCOUNTER — Inpatient Hospital Stay (HOSPITAL_COMMUNITY)
Admission: AD | Admit: 2014-10-10 | Discharge: 2014-10-10 | Disposition: A | Discharge status: Home / Self Care | Payer: Self-pay | Source: Ambulatory Visit | Attending: Family Medicine | Admitting: Family Medicine

## 2014-10-10 ENCOUNTER — Encounter (HOSPITAL_COMMUNITY): Payer: Self-pay | Admitting: *Deleted

## 2014-10-10 DIAGNOSIS — F17200 Nicotine dependence, unspecified, uncomplicated: Secondary | ICD-10-CM | POA: Insufficient documentation

## 2014-10-10 DIAGNOSIS — O200 Threatened abortion: Secondary | ICD-10-CM | POA: Insufficient documentation

## 2014-10-10 LAB — HCG, QUANTITATIVE, PREGNANCY: hCG, Beta Chain, Quant, S: 43 m[IU]/mL — ABNORMAL HIGH (ref <5)

## 2014-10-10 NOTE — Discharge Instructions (Signed)
Threatened Miscarriage °A threatened miscarriage occurs when you have vaginal bleeding during your first 20 weeks of pregnancy but the pregnancy has not ended. If you have vaginal bleeding during this time, your health care provider will do tests to make sure you are still pregnant. If the tests show you are still pregnant and the developing baby (fetus) inside your womb (uterus) is still growing, your condition is considered a threatened miscarriage. °A threatened miscarriage does not mean your pregnancy will end, but it does increase the risk of losing your pregnancy (complete miscarriage). °CAUSES  °The cause of a threatened miscarriage is usually not known. If you go on to have a complete miscarriage, the most common cause is an abnormal number of chromosomes in the developing baby. Chromosomes are the structures inside cells that hold all your genetic material. °Some causes of vaginal bleeding that do not result in miscarriage include: °· Having sex. °· Having an infection. °· Normal hormone changes of pregnancy. °· Bleeding that occurs when an egg implants in your uterus. °RISK FACTORS °Risk factors for bleeding in early pregnancy include: °· Obesity. °· Smoking. °· Drinking excessive amounts of alcohol or caffeine. °· Recreational drug use. °SIGNS AND SYMPTOMS °· Light vaginal bleeding. °· Mild abdominal pain or cramps. °DIAGNOSIS  °If you have bleeding with or without abdominal pain before 20 weeks of pregnancy, your health care provider will do tests to check whether you are still pregnant. One important test involves using sound waves and a computer (ultrasound) to create images of the inside of your uterus. Other tests include an internal exam of your vagina and uterus (pelvic exam) and measurement of your baby's heart rate.  °You may be diagnosed with a threatened miscarriage if: °· Ultrasound testing shows you are still pregnant. °· Your baby's heart rate is strong. °· A pelvic exam shows that the  opening between your uterus and your vagina (cervix) is closed. °· Your heart rate and blood pressure are stable. °· Blood tests confirm you are still pregnant. °TREATMENT  °No treatments have been shown to prevent a threatened miscarriage from going on to a complete miscarriage. However, the right home care is important.  °HOME CARE INSTRUCTIONS  °· Make sure you keep all your appointments for prenatal care. This is very important. °· Get plenty of rest. °· Do not have sex or use tampons if you have vaginal bleeding. °· Do not douche. °· Do not smoke or use recreational drugs. °· Do not drink alcohol. °· Avoid caffeine. °SEEK MEDICAL CARE IF: °· You have light vaginal bleeding or spotting while pregnant. °· You have abdominal pain or cramping. °· You have a fever. °SEEK IMMEDIATE MEDICAL CARE IF: °· You have heavy vaginal bleeding. °· You have blood clots coming from your vagina. °· You have severe low back pain or abdominal cramps. °· You have fever, chills, and severe abdominal pain. °MAKE SURE YOU: °· Understand these instructions. °· Will watch your condition. °· Will get help right away if you are not doing well or get worse. °  °This information is not intended to replace advice given to you by your health care provider. Make sure you discuss any questions you have with your health care provider. °  °Document Released: 12/21/2004 Document Revised: 12/26/2012 Document Reviewed: 10/17/2012 °Elsevier Interactive Patient Education ©2016 Else[employer] °Ectopic Pregnancy °An ectopic pregnancy is when the fertilized egg attaches (implants) outside the uterus. Most ectopic pregnancies occur in the fallopian tube. Rarely do ectopic pregnancies occur   on the ovary, intestine, pelvis, or cervix. In an ectopic pregnancy, the fertilized egg does not have the ability to develop into a normal, healthy baby.  °A ruptured ectopic pregnancy is one in which the fallopian tube gets torn or bursts and results in internal bleeding.  Often there is intense abdominal pain, and sometimes, vaginal bleeding. Having an ectopic pregnancy can be life threatening. If left untreated, this dangerous condition can lead to a blood transfusion, abdominal surgery, or even death. °CAUSES  °Damage to the fallopian tubes is the suspected cause in most ectopic pregnancies.  °RISK FACTORS °Depending on your circumstances, the risk of having an ectopic pregnancy will vary. The level of risk can be divided into three categories. °High Risk °· You have gone through infertility treatment. °· You have had a previous ectopic pregnancy. °· You have had previous tubal surgery. °· You have had previous surgery to have the fallopian tubes tied (tubal ligation). °· You have tubal problems or diseases. °· You have been exposed to DES. DES is a medicine that was used until 1971 and had effects on babies whose mothers took the medicine. °· You become pregnant while using an intrauterine device (IUD) for birth control.  °Moderate Risk °· You have a history of infertility. °· You have a history of a sexually transmitted infection (STI). °· You have a history of pelvic inflammatory disease (PID). °· You have scarring from endometriosis. °· You have multiple sexual partners. °· You smoke.  °Low Risk °· You have had previous pelvic surgery. °· You use vaginal douching. °· You became sexually active before 22 years of age. °SIGNS AND SYMPTOMS  °An ectopic pregnancy should be suspected in anyone who has missed a period and has abdominal pain or bleeding. °· You may experience normal pregnancy symptoms, such as: °· Nausea. °· Tiredness. °· Breast tenderness. °· Other symptoms may include: °· Pain with intercourse. °· Irregular vaginal bleeding or spotting. °· Cramping or pain on one side or in the lower abdomen. °· Fast heartbeat. °· Passing out while having a bowel movement. °· Symptoms of a ruptured ectopic pregnancy and internal bleeding may include: °· Sudden, severe pain in the  abdomen and pelvis. °· Dizziness or fainting. °· Pain in the shoulder area. °DIAGNOSIS  °Tests that may be performed include: °· A pregnancy test. °· An ultrasound test. °· Testing the specific level of pregnancy hormone in the bloodstream. °· Taking a sample of uterus tissue (dilation and curettage, D&C). °· Surgery to perform a visual exam of the inside of the abdomen using a thin, lighted tube with a tiny camera on the end (laparoscope). °TREATMENT  °An injection of a medicine called methotrexate may be given. This medicine causes the pregnancy tissue to be absorbed. It is given if: °· The diagnosis is made early. °· The fallopian tube has not ruptured. °· You are considered to be a good candidate for the medicine. °Usually, pregnancy hormone blood levels are checked after methotrexate treatment. This is to be sure the medicine is effective. It may take 4-6 weeks for the pregnancy to be absorbed (though most pregnancies will be absorbed by 3 weeks). °Surgical treatment may be needed. A laparoscope may be used to remove the pregnancy tissue. If severe internal bleeding occurs, a cut (incision) may be made in the lower abdomen (laparotomy), and the ectopic pregnancy is removed. This stops the bleeding. Part of the fallopian tube, or the whole tube, may be removed as well (salpingectomy). After surgery, pregnancy hormone tests   may be done to be sure there is no pregnancy tissue left. You may receive a Rho (D) immune globulin shot if you are Rh negative and the father is Rh positive, or if you do not know the Rh type of the father. This is to prevent problems with any future pregnancy. °SEEK IMMEDIATE MEDICAL CARE IF:  °You have any symptoms of an ectopic pregnancy. This is a medical emergency. °MAKE SURE YOU: °· Understand these instructions. °· Will watch your condition. °· Will get help right away if you are not doing well or get worse. °  °This information is not intended to replace advice given to you by your  health care provider. Make sure you discuss any questions you have with your health care provider. °  °Document Released: 01/29/2004 Document Revised: 01/11/2014 Document Reviewed: 07/20/2012 °Elsevier Interactive Patient Education ©2016 Elsevier Inc. ° °

## 2014-10-10 NOTE — MAU Provider Note (Signed)
History     CSN: [id number]  Arrival date and time: 10/10/14 1324   First Provider Initiated Contact with Patient 10/10/14 1516      No chief complaint on file.  HPI  Carolyn Barajas 24 y.o. G1P0000 @ [redacted]w[redacted]d presents for f/u bloodwork.  She was seen at [facility] ED 2 days ago with positive pregnancy test, HCG of 63 and questionable ectopic pregnancy.  Today, she had some mild pain this am requiring tylenol but none since.  She is continuing to bleed but states it is similar to menses.  She denies weakness, fever, dysuria.    US Ob Comp Less 14 Wks  10/08/2014   CLINICAL DATA:  Right lower quadrant pain for 3 days. Vaginal bleeding.  EXAM: OBSTETRIC <14 WK [place] AND TRANSVAGINAL OB US  TECHNIQUE: Both transabdominal and transvaginal ultrasound examinations were performed for complete evaluation of the gestation as well as the maternal uterus, adnexal regions, and pelvic cul-de-sac. Transvaginal technique was performed to assess early pregnancy.  COMPARISON:  None.  FINDINGS: Intrauterine gestational sac: Not visualized  Yolk sac:  Not visualized  Embryo:  Not visualized  Maternal uterus/adnexae: Within the right adnexa, there is a 3.0 x 2.4 x 2.4 cm heterogeneous mixed hypo and hyperechoic structure. This demonstrates some peripheral color blood flow. No left adnexal masses. Small amount of free fluid in the right adnexal region. Endometrium 7 mm in thickness.  IMPRESSION: No visible intrauterine pregnancy. There is a complex hypoechoic and hyperechoic mixed structure adjacent to the right ovary measuring up to 3.0 cm. Small amount of adjacent free fluid. Cannot completely exclude right ectopic pregnancy.  Critical Value/emergent results were called by telephone at the time of interpretation on 10/08/2014 at 4:36 pm to Dr. Fayrene Helper , who verbally acknowledged these results.   Electronically Signed   By: Charlett Nose M.D.   On: 10/08/2014 16:39   US Ob Transvaginal  10/08/2014   CLINICAL DATA:  Right lower  quadrant pain for 3 days. Vaginal bleeding.  EXAM: OBSTETRIC <14 WK [place] AND TRANSVAGINAL OB US  TECHNIQUE: Both transabdominal and transvaginal ultrasound examinations were performed for complete evaluation of the gestation as well as the maternal uterus, adnexal regions, and pelvic cul-de-sac. Transvaginal technique was performed to assess early pregnancy.  COMPARISON:  None.  FINDINGS: Intrauterine gestational sac: Not visualized  Yolk sac:  Not visualized  Embryo:  Not visualized  Maternal uterus/adnexae: Within the right adnexa, there is a 3.0 x 2.4 x 2.4 cm heterogeneous mixed hypo and hyperechoic structure. This demonstrates some peripheral color blood flow. No left adnexal masses. Small amount of free fluid in the right adnexal region. Endometrium 7 mm in thickness.  IMPRESSION: No visible intrauterine pregnancy. There is a complex hypoechoic and hyperechoic mixed structure adjacent to the right ovary measuring up to 3.0 cm. Small amount of adjacent free fluid. Cannot completely exclude right ectopic pregnancy.  Critical Value/emergent results were called by telephone at the time of interpretation on 10/08/2014 at 4:36 pm to Dr. Fayrene Helper , who verbally acknowledged these results.   Electronically Signed   By: Charlett Nose M.D.   On: 10/08/2014 16:39   OB History    Gravida Para Term Preterm AB TAB SAB Ectopic Multiple Living        No past medical history on file.  No past surgical history on file.  Family History  Problem Relation Age of Onset  . Breast  cancer Mother   . Cancer Mother     lung    Social History  Substance Use Topics  . Smoking status: Current Every Day Smoker -- 1.00 packs/day  . Smokeless tobacco: Not on file  . Alcohol Use: 4.2 oz/week    7 Cans of beer per week    Allergies: No Known Allergies  Prescriptions prior to admission  Medication Sig Dispense Refill Last Dose  . acetaminophen (TYLENOL) 500 MG tablet Take 1 tablet (500 mg total)  by mouth every 6 (six) hours as needed. 30 tablet 0   . albuterol (PROVENTIL HFA;VENTOLIN HFA) 108 (90 BASE) MCG/ACT inhaler Inhale 2 puffs into the lungs every 6 (six) hours as needed. For shortness of breath    Not Taking  . amoxicillin (AMOXIL) 500 MG capsule Take 1 capsule (500 mg total) by mouth 3 (three) times daily. (Patient not taking: Reported on 11/28/2013) 21 capsule 0 Not Taking  . amoxicillin-clavulanate (AUGMENTIN) 875-125 MG per tablet Take 1 tablet by mouth every 12 (twelve) hours. (Patient not taking: Reported on 11/28/2013) 14 tablet 0 Not Taking  . benzocaine-menthol (SORE THROAT LOZENGES) 6-10 MG lozenge Take 1 lozenge by mouth every 2 (two) hours as needed for pain. (Patient not taking: Reported on 11/28/2013) 30 lozenge 0 Not Taking  . cephALEXin (KEFLEX) 500 MG capsule Take 1 capsule (500 mg total) by mouth 4 (four) times daily. (Patient not taking: Reported on 11/28/2013) 40 capsule 0 Not Taking  . ibuprofen (ADVIL,MOTRIN) 600 MG tablet Take 1 tablet (600 mg total) by mouth every 6 (six) hours as needed for pain. (Patient not taking: Reported on 11/28/2013) 20 tablet 0 Not Taking  . PENICILLIN V POTASSIUM PO Take 30 mLs by mouth 2 (two) times daily.     Not Taking  . sulfamethoxazole-trimethoprim (SEPTRA DS) 800-160 MG per tablet Take 1 tablet by mouth 2 (two) times daily. (Patient not taking: Reported on 11/28/2013) 28 tablet 0 Not Taking  . traMADol (ULTRAM) 50 MG tablet Take 1 tablet (50 mg total) by mouth every 6 (six) hours as needed. (Patient not taking: Reported on 11/28/2013) 15 tablet 0 Not Taking    ROS Pertinent ROS in HPI.  All other systems are negative.   Physical Exam   Blood pressure 120/62, temperature 98 F (36.7 C), last menstrual period 09/05/2014.  Physical Exam  Constitutional: She is oriented to person, place, and time. She appears well-developed and well-nourished. No distress.  HENT:  Head: Normocephalic and atraumatic.  Eyes: EOM are normal.   Neck: Normal range of motion.  Cardiovascular: Normal rate.   Respiratory: No respiratory distress.  Musculoskeletal: Normal range of motion.  Neurological: She is alert and oriented to person, place, and time.  Skin: Skin is warm and dry.  Psychiatric: She has a normal mood and affect. Her behavior is normal.   Results for orders placed or performed during the hospital encounter of 10/10/14 (from the past 72 hour(s))  hCG, quantitative, pregnancy     Status: Abnormal   Collection Time: 10/10/14  1:57 PM  Result Value Ref Range   hCG, Beta Chain, Quant, S 43 (H) <5 mIU/mL    Comment:          GEST. AGE      CONC.  (mIU/mL)   <=1 WEEK        5 - 50     2 WEEKS       50 - 500     3 WEEKS  100 - 10,000     4 WEEKS     1,000 - 30,000     5 WEEKS     3,500 - 115,000   6-8 WEEKS     12,000 - 270,000    12 WEEKS     15,000 - 220,000        FEMALE AND NON-PREGNANT FEMALE:     LESS Kristiansen 5 mIU/mL    HCG on 10/4: 63 MAU Course  Procedures  MDM Pt case discussed with Dr. Adrian Blackwater.  No need for further workup today.  Pt to return for Quant in 1 week.   Assessment and Plan  A:  1. Threatened abortion in early pregnancy    P: Discharge to home Ectopic precautions stressed.  Pt to return to [facility] immediately for worsening of sxs Return to [facility] in 1 week for Quant HCG Pelvic rest Patient may return to MAU as needed or if her condition were to change or worsen   Bertram Denver 10/10/2014, 4:05 PM

## 2014-10-10 NOTE — MAU Note (Signed)
Pt presents to MAU for repeat BHCG. Denies any pain, scant amount of vaginal bleeding

## 2014-10-17 ENCOUNTER — Inpatient Hospital Stay (HOSPITAL_COMMUNITY)
Admission: AD | Admit: 2014-10-17 | Discharge: 2014-10-17 | Disposition: A | Discharge status: Home / Self Care | Payer: Self-pay | Source: Ambulatory Visit | Attending: Obstetrics & Gynecology | Admitting: Obstetrics & Gynecology

## 2014-10-17 DIAGNOSIS — O039 Complete or unspecified spontaneous abortion without complication: Secondary | ICD-10-CM | POA: Insufficient documentation

## 2014-10-17 LAB — HCG, QUANTITATIVE, PREGNANCY: HCG, BETA CHAIN, QUANT, S: 1 m[IU]/mL (ref <5)

## 2014-10-17 NOTE — MAU Note (Signed)
Here for follow up blood work.  Feeling good, no pain, still having a little bleeding

## 2014-10-17 NOTE — [facility] Provider Note (Signed)
History   Chief Complaint:  Follow-up   Carolyn Barajas is  24 y.o. G1P0000 Patient's last menstrual period was 09/05/2014.Marland Kitchen. Patient is here for follow up of quantitative HCG and ongoing surveillance of pregnancy status.   She is 2238w0d weeks gestation  by LMP.    Since her last visit, the patient is without new complaint.   The patient reports bleeding as spotting.  Denies abdominal pain.  General ROS:  negative for fever, chills.  Her previous Quantitative HCG values are:  Results for [name], Carolyn (MRN [id number]) as of 10/17/2014 17:41  Ref. Range 10/08/2014 15:10 10/10/2014 13:57  HCG, Beta Chain, Quant, S Latest Ref Range: <5 mIU/mL 63 (H) 43 (H)    Physical Exam   Last menstrual period 09/05/2014.  BP 128/68 mmHg  Pulse 87  Temp(Src) 98.9 F (37.2 C) (Oral)  Resp 18  LMP 09/05/2014 Constitutional: Well-nourished female in no apparent distress. No pallor. Heart: Regular rate Respiratory: Normal rate and effort Focused Gynecological Exam: Examination not indicated  Labs: Results for orders placed or performed during the hospital encounter of 10/17/14 (from the past 24 hour(s))  hCG, quantitative, pregnancy   Collection Time: 10/17/14  5:35 PM  Result Value Ref Range   hCG, Beta Chain, Quant, S 1 <5 mIU/mL    Ultrasound Studies:   [place] Ob Comp Less 14 Wks  10/08/2014  CLINICAL DATA:  Right lower quadrant pain for 3 days. Vaginal bleeding. EXAM: OBSTETRIC <14 WK US AND TRANSVAGINAL OB US TECHNIQUE: Both transabdominal and transvaginal ultrasound examinations were performed for complete evaluation of the gestation as well as the maternal uterus, adnexal regions, and pelvic cul-de-sac. Transvaginal technique was performed to assess early pregnancy. COMPARISON:  None. FINDINGS: Intrauterine gestational sac: Not visualized Yolk sac:  Not visualized Embryo:  Not visualized Maternal uterus/adnexae: Within the right adnexa, there is a 3.0 x 2.4 x 2.4 cm heterogeneous mixed hypo and  hyperechoic structure. This demonstrates some peripheral color blood flow. No left adnexal masses. Small amount of free fluid in the right adnexal region. Endometrium 7 mm in thickness. IMPRESSION: No visible intrauterine pregnancy. There is a complex hypoechoic and hyperechoic mixed structure adjacent to the right ovary measuring up to 3.0 cm. Small amount of adjacent free fluid. Cannot completely exclude right ectopic pregnancy. Critical Value/emergent results were called by telephone at the time of interpretation on 10/08/2014 at 4:36 pm to Dr. Fayrene HelperBOWIE TRAN , who verbally acknowledged these results. Electronically Signed   By: Charlett NoseKevin  Dover M.D.   On: 10/08/2014 16:39   [place] Ob Transvaginal  10/08/2014  CLINICAL DATA:  Right lower quadrant pain for 3 days. Vaginal bleeding. EXAM: OBSTETRIC <14 WK US AND TRANSVAGINAL OB US TECHNIQUE: Both transabdominal and transvaginal ultrasound examinations were performed for complete evaluation of the gestation as well as the maternal uterus, adnexal regions, and pelvic cul-de-sac. Transvaginal technique was performed to assess early pregnancy. COMPARISON:  None. FINDINGS: Intrauterine gestational sac: Not visualized Yolk sac:  Not visualized Embryo:  Not visualized Maternal uterus/adnexae: Within the right adnexa, there is a 3.0 x 2.4 x 2.4 cm heterogeneous mixed hypo and hyperechoic structure. This demonstrates some peripheral color blood flow. No left adnexal masses. Small amount of free fluid in the right adnexal region. Endometrium 7 mm in thickness. IMPRESSION: No visible intrauterine pregnancy. There is a complex hypoechoic and hyperechoic mixed structure adjacent to the right ovary measuring up to 3.0 cm. Small amount of adjacent free fluid. Cannot completely exclude right ectopic pregnancy. Critical Value/emergent  results were called by telephone at the time of interpretation on 10/08/2014 at 4:36 pm to Dr. Fayrene Helper , who verbally acknowledged these results.  Electronically Signed   By: Charlett Nose M.D.   On: 10/08/2014 16:39    Assessment: 1. Complete miscarriage    Plan: Discharge home in stable condition. Brief discussion of preconception counseling versus contraceptive options. Patient unsure. Encouraged to start folic acid-containing vitamin just in case. Follow-up Information    Follow up with Gynecologist .   Why:  Routine gynecology care       Medication List    TAKE these medications        acetaminophen 500 MG tablet  Commonly known as:  TYLENOL  Take 1 tablet (500 mg total) by mouth every 6 (six) hours as needed.     albuterol 108 (90 BASE) MCG/ACT inhaler  Commonly known as:  PROVENTIL HFA;VENTOLIN HFA  Inhale 2 puffs into the lungs every 6 (six) hours as needed. For shortness of breath       Dorathy Kinsman 10/17/2014, 5:43 PM

## 2014-10-17 NOTE — Discharge Instructions (Signed)
Miscarriage  A miscarriage is the sudden loss of an unborn baby (fetus) before the 20th week of pregnancy. Most miscarriages happen in the first 3 months of pregnancy. Sometimes, it happens before a woman even knows she is pregnant. A miscarriage is also called a "spontaneous miscarriage" or "early pregnancy loss." Having a miscarriage can be an emotional experience. Talk with your caregiver about any questions you may have about miscarrying, the grieving process, and your future pregnancy plans.  CAUSES    Problems with the fetal chromosomes that make it impossible for the baby to develop normally. Problems with the baby's genes or chromosomes are most often the result of errors that occur, by chance, as the embryo divides and grows. The problems are not inherited from the parents.   Infection of the cervix or uterus.    Hormone problems.    Problems with the cervix, such as having an incompetent cervix. This is when the tissue in the cervix is not strong enough to hold the pregnancy.    Problems with the uterus, such as an abnormally shaped uterus, uterine fibroids, or congenital abnormalities.    Certain medical conditions.    Smoking, drinking alcohol, or taking illegal drugs.    Trauma.   Often, the cause of a miscarriage is unknown.   SYMPTOMS    Vaginal bleeding or spotting, with or without cramps or pain.   Pain or cramping in the abdomen or lower back.   Passing fluid, tissue, or blood clots from the vagina.  DIAGNOSIS   Your caregiver will perform a physical exam. You may also have an ultrasound to confirm the miscarriage. Blood or urine tests may also be ordered.  TREATMENT    Sometimes, treatment is not necessary if you naturally pass all the fetal tissue that was in the uterus. If some of the fetus or placenta remains in the body (incomplete miscarriage), tissue left behind may become infected and must be removed. Usually, a dilation and curettage (D and C) procedure is performed.  During a D and C procedure, the cervix is widened (dilated) and any remaining fetal or placental tissue is gently removed from the uterus.   Antibiotic medicines are prescribed if there is an infection. Other medicines may be given to reduce the size of the uterus (contract) if there is a lot of bleeding.   If you have Rh negative blood and your baby was Rh positive, you will need a Rh immunoglobulin shot. This shot will protect any future baby from having Rh blood problems in future pregnancies.  HOME CARE INSTRUCTIONS    Your caregiver may order bed rest or may allow you to continue light activity. Resume activity as directed by your caregiver.   Have someone help with home and family responsibilities during this time.    Keep track of the number of sanitary pads you use each day and how soaked (saturated) they are. Write down this information.    Do not use tampons. Do not douche or have sexual intercourse until approved by your caregiver.    Only take over-the-counter or prescription medicines for pain or discomfort as directed by your caregiver.    Do not take aspirin. Aspirin can cause bleeding.    Keep all follow-up appointments with your caregiver.    If you or your partner have problems with grieving, talk to your caregiver or seek counseling to help cope with the pregnancy loss. Allow enough time to grieve before trying to get pregnant again.     SEEK IMMEDIATE MEDICAL CARE IF:    You have severe cramps or pain in your back or abdomen.   You have a fever.   You pass large blood clots (walnut-sized or larger) ortissue from your vagina. Save any tissue for your caregiver to inspect.    Your bleeding increases.    You have a thick, bad-smelling vaginal discharge.   You become lightheaded, weak, or you faint.    You have chills.   MAKE SURE YOU:   Understand these instructions.   Will watch your condition.   Will get help right away if you are not doing well or get worse.     This  information is not intended to replace advice given to you by your health care provider. Make sure you discuss any questions you have with your health care provider.     Document Released: 06/16/2000 Document Revised: 04/17/2012 Document Reviewed: 02/09/2011  Elsevier Interactive Patient Education 2016 [employer]

## 2015-08-15 ENCOUNTER — Encounter (HOSPITAL_COMMUNITY): Payer: Self-pay

## 2016-01-18 ENCOUNTER — Encounter (HOSPITAL_COMMUNITY): Payer: Self-pay | Admitting: *Deleted

## 2016-01-18 ENCOUNTER — Emergency Department (HOSPITAL_COMMUNITY)
Admission: EM | Admit: 2016-01-18 | Discharge: 2016-01-18 | Disposition: A | Discharge status: Other Acute Inpatient Hospital | Payer: Self-pay | Attending: Emergency Medicine | Admitting: Emergency Medicine

## 2016-01-18 ENCOUNTER — Inpatient Hospital Stay (HOSPITAL_COMMUNITY)
Admission: AD | Admit: 2016-01-18 | Discharge: 2016-01-22 | DRG: 885 | Disposition: A | Discharge status: Home / Self Care | Payer: Federal, State, Local not specified - Other | Attending: Psychiatry | Admitting: Psychiatry

## 2016-01-18 DIAGNOSIS — Z79899 Other long term (current) drug therapy: Secondary | ICD-10-CM | POA: Diagnosis not present

## 2016-01-18 DIAGNOSIS — F322 Major depressive disorder, single episode, severe without psychotic features: Principal | ICD-10-CM | POA: Diagnosis present

## 2016-01-18 DIAGNOSIS — F411 Generalized anxiety disorder: Secondary | ICD-10-CM | POA: Diagnosis present

## 2016-01-18 DIAGNOSIS — G4700 Insomnia, unspecified: Secondary | ICD-10-CM | POA: Diagnosis present

## 2016-01-18 DIAGNOSIS — F17200 Nicotine dependence, unspecified, uncomplicated: Secondary | ICD-10-CM | POA: Insufficient documentation

## 2016-01-18 DIAGNOSIS — F1420 Cocaine dependence, uncomplicated: Secondary | ICD-10-CM | POA: Diagnosis present

## 2016-01-18 DIAGNOSIS — F332 Major depressive disorder, recurrent severe without psychotic features: Secondary | ICD-10-CM | POA: Diagnosis present

## 2016-01-18 DIAGNOSIS — F17210 Nicotine dependence, cigarettes, uncomplicated: Secondary | ICD-10-CM | POA: Diagnosis present

## 2016-01-18 DIAGNOSIS — R45851 Suicidal ideations: Secondary | ICD-10-CM

## 2016-01-18 DIAGNOSIS — Z23 Encounter for immunization: Secondary | ICD-10-CM | POA: Diagnosis not present

## 2016-01-18 DIAGNOSIS — T452X2A Poisoning by vitamins, intentional self-harm, initial encounter: Secondary | ICD-10-CM | POA: Diagnosis present

## 2016-01-18 DIAGNOSIS — Z801 Family history of malignant neoplasm of trachea, bronchus and lung: Secondary | ICD-10-CM | POA: Diagnosis not present

## 2016-01-18 DIAGNOSIS — Z803 Family history of malignant neoplasm of breast: Secondary | ICD-10-CM | POA: Diagnosis not present

## 2016-01-18 LAB — COMPREHENSIVE METABOLIC PANEL
ALT: 22 U/L (ref 14–54)
AST: 19 U/L (ref 15–41)
Albumin: 4.1 g/dL (ref 3.5–5.0)
Alkaline Phosphatase: 59 U/L (ref 38–126)
Anion gap: 8 (ref 5–15)
BUN: 17 mg/dL (ref 6–20)
CO2: 24 mmol/L (ref 22–32)
CREATININE: 0.77 mg/dL (ref 0.44–1.00)
Calcium: 9 mg/dL (ref 8.9–10.3)
Chloride: 103 mmol/L (ref 101–111)
GFR calc non Af Amer: >60 mL/min (ref >60)
Glucose, Bld: 97 mg/dL (ref 65–99)
POTASSIUM: 3.6 mmol/L (ref 3.5–5.1)
SODIUM: 135 mmol/L (ref 135–145)
Total Bilirubin: 0.7 mg/dL (ref 0.3–1.2)
Total Protein: 7.2 g/dL (ref 6.5–8.1)

## 2016-01-18 LAB — CBC
HCT: 44.7 % (ref 36.0–46.0)
Hemoglobin: 15.2 g/dL — ABNORMAL HIGH (ref 12.0–15.0)
MCH: 31 pg (ref 26.0–34.0)
MCHC: 34 g/dL (ref 30.0–36.0)
MCV: 91.2 fL (ref 78.0–100.0)
PLATELETS: 373 10*3/uL (ref 150–400)
RBC: 4.9 MIL/uL (ref 3.87–5.11)
RDW: 13 % (ref 11.5–15.5)
WBC: 12 10*3/uL — AB (ref 4.0–10.5)

## 2016-01-18 LAB — SALICYLATE LEVEL

## 2016-01-18 LAB — RAPID URINE DRUG SCREEN, HOSP PERFORMED
AMPHETAMINES: NOT DETECTED
BENZODIAZEPINES: NOT DETECTED
Barbiturates: NOT DETECTED
COCAINE: POSITIVE — AB
OPIATES: NOT DETECTED
TETRAHYDROCANNABINOL: POSITIVE — AB

## 2016-01-18 LAB — ETHANOL: Alcohol, Ethyl (B): <5 mg/dL (ref <5)

## 2016-01-18 LAB — ACETAMINOPHEN LEVEL

## 2016-01-18 MED ORDER — CITALOPRAM HYDROBROMIDE 10 MG PO TABS
10.0000 mg | ORAL_TABLET | Freq: Every day | ORAL | Status: DC
  Administered 2016-01-18: 10 mg via ORAL
  Filled 2016-01-18: qty 1

## 2016-01-18 MED ORDER — INFLUENZA VAC SPLIT QUAD 0.5 ML IM SUSY
0.5000 mL | PREFILLED_SYRINGE | INTRAMUSCULAR | Status: AC
  Administered 2016-01-19: 0.5 mL via INTRAMUSCULAR
  Filled 2016-01-18: qty 0.5

## 2016-01-18 MED ORDER — HYDROXYZINE HCL 25 MG PO TABS: 25.0000 mg | ORAL_TABLET | Freq: Four times a day (QID) | ORAL | Status: DC | PRN

## 2016-01-18 MED ORDER — CITALOPRAM HYDROBROMIDE 10 MG PO TABS
10.0000 mg | ORAL_TABLET | Freq: Every day | ORAL | Status: DC
  Administered 2016-01-19 – 2016-01-22 (×4): 10 mg via ORAL
  Filled 2016-01-18 (×2): qty 1
  Filled 2016-01-18: qty 7
  Filled 2016-01-18 (×3): qty 1

## 2016-01-18 MED ORDER — TRAZODONE HCL 50 MG PO TABS: 50.0000 mg | ORAL_TABLET | Freq: Every evening | ORAL | Status: DC | PRN

## 2016-01-18 MED ORDER — MAGNESIUM HYDROXIDE 400 MG/5ML PO SUSP: 30.0000 mL | Freq: Every day | ORAL | Status: DC | PRN

## 2016-01-18 MED ORDER — ACETAMINOPHEN 325 MG PO TABS
650.0000 mg | ORAL_TABLET | Freq: Four times a day (QID) | ORAL | Status: DC | PRN
Start: 2016-01-18 — End: 2016-01-22

## 2016-01-18 MED ORDER — TRAZODONE HCL 50 MG PO TABS
50.0000 mg | ORAL_TABLET | Freq: Every evening | ORAL | Status: DC | PRN
  Administered 2016-01-18: 50 mg via ORAL
  Filled 2016-01-18: qty 1

## 2016-01-18 MED ORDER — ALUM & MAG HYDROXIDE-SIMETH 200-200-20 MG/5ML PO SUSP: 30.0000 mL | ORAL | Status: DC | PRN

## 2016-01-18 NOTE — ED Notes (Signed)
Initial wanding by security complete

## 2016-01-18 NOTE — Progress Notes (Signed)
CSW spoke with patient at bedside regarding voluntary admission and consent for treatment. CSW obtained patient's signature on voluntary admission and consent for treatment form. CSW discussed consent to release information with patient. CSW obtained patient's signature on consent to release information form. CSW provided signed forms to patient's RN.

## 2016-01-18 NOTE — ED Notes (Signed)
[facility] Expected date:  Expected time:  Means of arrival:  Comments: Held for [phone]

## 2016-01-18 NOTE — ED Provider Notes (Signed)
[facility]-EMERGENCY DEPT Provider Note   CSN: [id number] Arrival date & time: 01/18/16  0231  By signing my name below, I, Talbert Nan, attest that this documentation has been prepared under the direction and in the presence of Shon Baton, MD. Electronically Signed: Talbert Nan, Scribe. 01/18/16. 3:39 AM.   History   Chief Complaint Chief Complaint  Patient presents with  . Suicidal    HPI HPI Comments: Carolyn Barajas is a 26 y.o. female who presents to the Emergency Department complaining of suicidal ideations. She stated that she was"not right in my head". She has thought about wanting to hurt herself, but she does not have a plan. She took 2 bottles of pills around 12 or 1 pm (>12 hrs ago), but does not know what type. She thinks they were dietary supplements or vitamins.  Denies tylenol or ASA ingestion. Denies any ingestion since. She has since had diarrhea and slept all day. She attempted suicide once before with resulting hospitalization. She drinks EtOH. Pt is a smoker. Denies drug use.    The history is provided by the patient. No language interpreter was used.    History reviewed. No pertinent past medical history.  There are no active problems to display for this patient.   History reviewed. No pertinent surgical history.  OB History    Gravida Para Term Preterm AB Living   1 0 0 0 0 0   SAB TAB Ectopic Multiple Live Births   0 0 0 0         Home Medications    Prior to Admission medications   Medication Sig Start Date End Date Taking? Authorizing Provider  acetaminophen (TYLENOL) 500 MG tablet Take 1 tablet (500 mg total) by mouth every 6 (six) hours as needed. Patient not taking: Reported on 01/18/2016 10/08/14   Fayrene Helper, PA-C    Family History Family History  Problem Relation Age of Onset  . Breast cancer Mother   . Cancer Mother     lung    Social History Social History  Substance Use Topics  . Smoking status: Current Every Day Smoker   Packs/day: 1.00  . Smokeless tobacco: Current User  . Alcohol use 4.2 oz/week    7 Cans of beer per week     Allergies   Patient has no known allergies.   Review of Systems Review of Systems  Constitutional: Negative for fever.  Respiratory: Negative for shortness of breath.   Musculoskeletal: Positive for myalgias.  Psychiatric/Behavioral: Positive for sleep disturbance and suicidal ideas.  All other systems reviewed and are negative.    Physical Exam Updated Vital Signs BP 140/90 (BP Location: Right Arm)   Pulse 84   Temp 97.9 F (36.6 C) (Oral)   Resp 15   Ht 5\' 2"  (1.575 m)   Wt 180 lb (81.6 kg)   LMP 01/14/2016   SpO2 99%   BMI 32.92 kg/m   Physical Exam  Constitutional: She is oriented to person, place, and time. She appears well-developed and well-nourished.  HENT:  Head: Normocephalic and atraumatic.  Cardiovascular: Normal rate, regular rhythm and normal heart sounds.   Pulmonary/Chest: Effort normal. No respiratory distress. She has no wheezes.  Abdominal: Soft. There is no tenderness.  Neurological: She is alert and oriented to person, place, and time.  Skin: Skin is warm and dry.  Psychiatric: She has a normal mood and affect.  Will not make eye contact, depressed mood  Nursing note and vitals reviewed.  ED Treatments / Results   DIAGNOSTIC STUDIES: Oxygen Saturation is 99% on room air, normal by my interpretation.    COORDINATION OF CARE: 3:39 AM Discussed treatment plan with pt at bedside and pt agreed to plan.    Labs (all labs ordered are listed, but only abnormal results are displayed) Labs Reviewed  ACETAMINOPHEN LEVEL - Abnormal; Notable for the following:       Result Value   Acetaminophen (Tylenol), Serum <10 (*)    All other components within normal limits  CBC - Abnormal; Notable for the following:    WBC 12.0 (*)    Hemoglobin 15.2 (*)    All other components within normal limits  RAPID URINE DRUG SCREEN, HOSP PERFORMED  - Abnormal; Notable for the following:    Cocaine POSITIVE (*)    Tetrahydrocannabinol POSITIVE (*)    All other components within normal limits  COMPREHENSIVE METABOLIC PANEL  ETHANOL  SALICYLATE LEVEL    EKG  EKG Interpretation None       Radiology No results found.  Procedures Procedures (including critical care time)  Medications Ordered in ED Medications - No data to display   Initial Impression / Assessment and Plan / ED Course  I have reviewed the triage vital signs and the nursing notes.  Pertinent labs & imaging results that were available during my care of the patient were reviewed by me and considered in my medical decision making (see chart for details).  Clinical Course     Patient presents with suicidal ideation. Reports ingestion over 12 hours ago of dietary supplements and vitamins. Denies Tylenol or aspirin ingestion. She is nontoxic. Vital signs are reassuring and physical exam is benign. Basic labwork including Tylenol and salicylate levels are reassuring. Patient is medically cleared for TTS evaluation.  Final Clinical Impressions(s) / ED Diagnoses   Final diagnoses:  Suicidal ideation    New Prescriptions New Prescriptions   No medications on file   I personally performed the services described in this documentation, which was scribed in my presence. The recorded information has been reviewed and is accurate.     Shon Batonourtney F Horton, MD 01/18/16 [phone]

## 2016-01-18 NOTE — Progress Notes (Addendum)
Admit note:  Patient is a 26 yo female admitted for suicidal ideation.  [facility] coworker.  Patient states she has been suffering with depression.  She feels she cannot return to her home because she plans to overdose.  Patient states she took 2 bottles of Rolera (a chinese vitamin).  Patient states she does not know who the pills belong to.  Patient has reported some diarrhea, otherwise no specific side effects.  Patient reports this is her first psyche admission.  She states, "my ex was here.  I guess it's my turn." Patient states her main support is her coworker. She does not have family in the area.  [place].  She reports daily alcohol use.  She reports drinking 2-3 shots per day.  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  She uses 0.5 gram of cocaine weekly.  She denies any thoughts of self harm currently.  She denies HI/AVH.  Patient appears ambivalent regarding treatment.  She has irritable mood.  She is cooperative with staff.  Patient has no pertinent medical hx.

## 2016-01-18 NOTE — ED Notes (Signed)
Patient currently in room changing into scrubs.

## 2016-01-18 NOTE — ED Notes (Signed)
Pt admitted to room 35.  Pt is tearful and does not make eye contact.  Pt sts this is first time seeking help for depression and is unsure what options are available to her.  Pt sts she is not on any medications for depression and does not know what to do regarding medication options. Pt denies pain or discomfort.  Pt denies SI, Hi and AVH at this time. Pt offered encouragement and support. Pt is employed and does not attend school. Pt currently awake and resting in bed.

## 2016-01-18 NOTE — ED Triage Notes (Signed)
Pt reports she just does not feel right in her mind tonight, reports SI without a plan, denies HI.

## 2016-01-18 NOTE — BH Assessment (Addendum)
Tele Assessment Note   Carolyn Barajas is a 26 y.o. female who presents voluntarily to [facility] as a walk-in  accompanied by her co-worker. Pt states she has been suffering from depression for a long time. Pt states she is currently suicidal and should not return to her home because she will attempt overdosing again. Pt stated she took 2 bottles of Rolera (a chinese vitamin) earlier today. Pt states she found the pills in her home and does not know who they belong to or what they are for. Pt states she has been a sleep all day and has constant diarrhea from the pills. Pt denies all other side effects from pill overdose.  Pt currently denies H/I, AV hallucinations, and abuse history. Pt states she never received treatment for depression. Pt states she drinks alcohol and smokes marijuana daily to cope with depression, as well as, doing cocaine on occasions. Pt has not had any SA treatment or prior hospitalizations.   Pt states she lives alone and does not like being around a lot of people. 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  Pt states she has little to no family contact.  Pt also stated she recently broke up with her boyfriend.    Pt was causally dressed but was disheveled. Pt was alert and oriented x4. Pt's thought process was impaired and not coherent. Pt did not appear to be responding to any internal stimuli.   Diagnosis: Major Depressive Disorder, Recurrent, Severe  Past Medical History: No past medical history on file.  No past surgical history on file.  Family History:  Family History  Problem Relation Age of Onset  . Breast cancer Mother   . Cancer Mother     lung    Social History:  reports that she has been smoking.  She has been smoking about 1.00 pack per day. She does not have any smokeless tobacco history on file. She reports that she drinks about 4.2 oz of alcohol per week . She reports that she  does not use drugs.  Additional Social History:  Alcohol / Drug Use Pain Medications: unknown  Prescriptions: unknown Over the Counter: unknown  History of alcohol / drug use?: (S) Yes Negative Consequences of Use: Personal relationships, Work / Programmer, multimedia, Surveyor, quantity Withdrawal Symptoms: Diarrhea, Agitation Substance #1 Name of Substance 1: Alcohol  1 - Age of First Use: teenager 1 - Amount (size/oz): multiple drinks  1 - Frequency: daily 1 - Duration: ongoing 1 - Last Use / Amount: 01/17/16 Substance #2 Name of Substance 2: Marijuana  2 - Age of First Use: 17  2 - Amount (size/oz): 3 blunts 2 - Frequency: weekly 2 - Duration: ongoing 2 - Last Use / Amount: last week  Substance #3 Name of Substance 3: Cocaine 3 - Age of First Use: 24 3 - Amount (size/oz): at least a gram 3 - Frequency: on occasions; past 2 weeks/daily 3 - Duration: ongoing  3 - Last Use / Amount: 01/17/16  CIWA: CIWA-Ar BP: 122/76 Pulse Rate: 95 COWS:    PATIENT STRENGTHS: (choose at least two) Ability for insight Average or above average intelligence Capable of independent living Communication skills General fund of knowledge Motivation for treatment/growth  Allergies: No Known Allergies  Home Medications:  (Not in a hospital admission)  OB/GYN Status:  No LMP recorded.  General Assessment Data Location of Assessment: [facility]  TTS Assessment: In system Is this a Tele or Face-to-Face Assessment?: Face-to-Face Is this an Initial Assessment or a Re-assessment for this encounter?: Initial Assessment Marital status: Single Is patient pregnant?: No Pregnancy Status: No Living Arrangements: Alone Can pt return to current living arrangement?: No Admission Status: Voluntary Is patient capable of signing voluntary admission?: Yes Referral Source: Self/Family/Friend Insurance type: none  Medical Screening Exam [facility] Walk-in ONLY) Medical Exam completed: Yes  Crisis Care Plan Living  Arrangements: Alone Name of Psychiatrist: none Name of Therapist: none  Education Status Is patient currently in school?: No Current Grade: na Highest grade of school patient has completed: 11th Name of school: na Contact person: na  Risk to self with the past 6 months Suicidal Ideation: Yes-Currently Present Has patient been a risk to self within the past 6 months prior to admission? : Yes Suicidal Intent: Yes-Currently Present Has patient had any suicidal intent within the past 6 months prior to admission? : Yes Is patient at risk for suicide?: Yes Suicidal Plan?: Yes-Currently Present Has patient had any suicidal plan within the past 6 months prior to admission? : Yes Specify Current Suicidal Plan: overdose on medications in her home Access to Means: Yes Specify Access to Suicidal Means: in her house What has been your use of drugs/alcohol within the last 12 months?: ongoing Previous Attempts/Gestures: Yes How many times?: 1 Other Self Harm Risks: cutting Triggers for Past Attempts: Unknown Intentional Self Injurious Behavior: Cutting, Bruising Comment - Self Injurious Behavior: pt has cuts and bruises on both arms Family Suicide History: Unknown Recent stressful life event(s): Loss (Comment), Financial Problems, Trauma (Comment) Persecutory voices/beliefs?: No Depression: Yes Depression Symptoms: Insomnia, Tearfulness, Isolating, Fatigue, Loss of interest in usual pleasures, Feeling worthless/self pity, Feeling angry/irritable Substance abuse history and/or treatment for substance abuse?: No  Risk to Others within the past 6 months Homicidal Ideation: No Does patient have any lifetime risk of violence toward others beyond the six months prior to admission? : No Thoughts of Harm to Others: No Current Homicidal Intent: No Current Homicidal Plan: No Access to Homicidal Means: No Identified Victim: na History of harm to others?: No Assessment of Violence: None  Noted Violent Behavior Description: na Does patient have a court date: No Is patient on probation?: No  Psychosis Hallucinations: None noted Delusions: None noted  Mental Status Report Appearance/Hygiene: Disheveled Eye Contact: Fair Motor Activity: Freedom of movement Speech: Pressured, Logical/coherent Level of Consciousness: Alert Mood: Depressed, Anxious Affect: Anxious, Depressed Anxiety Level: Severe Thought Processes: Circumstantial, Flight of Ideas, Tangential Judgement: Impaired Orientation: Person, Place, Time, Situation Obsessive Compulsive Thoughts/Behaviors: None  Cognitive Functioning Concentration: Fair Memory: Recent Intact, Remote Intact IQ: Average Impulse Control: Fair Appetite: Fair Weight Loss: 0 Weight Gain: 0 Sleep: Decreased Total Hours of Sleep: 4 Vegetative Symptoms: None  ADLScreening [facility] Assessment Services) Patient's cognitive ability adequate to safely complete daily activities?: Yes Patient able to express need for assistance with ADLs?: Yes Independently performs ADLs?: Yes (appropriate for developmental age)  Prior Inpatient Therapy Prior Inpatient Therapy: No Prior Therapy Dates: na Prior Therapy Facilty/Provider(s): na Reason for Treatment: na  Prior Outpatient Therapy Prior Outpatient Therapy: No Prior Therapy Dates: na Prior Therapy Facilty/Provider(s): na Reason for Treatment: na Does patient have an ACCT team?: No Does patient have Intensive In-House Services?  : No Does patient have [facility] services? : No Does patient have P4CC services?: No  ADL Screening (condition at time of admission) Patient's cognitive ability adequate to safely complete daily activities?: Yes  Is the patient deaf or have difficulty hearing?: No Does the patient have difficulty seeing, even when wearing glasses/contacts?: No Does the patient have difficulty concentrating, remembering, or making decisions?: No Patient able to express need for  assistance with ADLs?: Yes Does the patient have difficulty dressing or bathing?: No Independently performs ADLs?: Yes (appropriate for developmental age) Does the patient have difficulty walking or climbing stairs?: No Weakness of Legs: None Weakness of Arms/Hands: None  Home Assistive Devices/Equipment Home Assistive Devices/Equipment: None    Abuse/Neglect Assessment (Assessment to be complete while patient is alone) Physical Abuse: Denies Verbal Abuse: Denies Sexual Abuse: Denies Exploitation of patient/patient's resources: Denies Self-Neglect: Denies     Merchant navy officerAdvance Directives (For Healthcare) Does Patient Have a Medical Advance Directive?: No Would patient like information on creating a medical advance directive?: No - Patient declined    Additional Information 1:1 In Past 12 Months?: No CIRT Risk: No Elopement Risk: No Does patient have medical clearance?: No  Child/Adolescent Assessment Running Away Risk: Denies  Disposition:  Gave clinical report to Nira ConnJason Berry, NP who states pt meets inpatient criteria. [facility]. Consulting civil engineerCharge RN [facility]. TTS will seek outside placement.   Orlie PollenAshley Trinnity Breunig, LPC, NCC,  LCAS-A Therapeutic Triage Specialist  01/18/2016 2:39 AM     Evlyn CourierAshley n Tnya Ades 01/18/2016 2:35 AM

## 2016-01-18 NOTE — [facility] Group Notes (Signed)
Pt did not attend group meeting this evening. Pt stayed in the bed asleep.

## 2016-01-18 NOTE — H&P (Signed)
Behavioral Health Medical Screening Exam  Carolyn Barajas is a 26 y.o. female who presents as a walk in. Reports that she drank 2 shots of B50 and took several capsules of Relora around noon today, does not admit that this was a suicide attempt, but it admits that she was trying to harm herself. Upon physical exam, noted bruising on bilateral forearms, patient reports that she does not know what happened. Originally patient denied self harming behaviors, but after noting several superficial cuts on her left wrist/forearm patient stated "I sat in the bathtub last night and cut myself." Reports that she does not feel safe being alone. Denies audiovisual hallucinations. Does not appear to be responding to internal stimuli.   Total Time spent with patient: 15 minutes  Psychiatric Specialty Exam: Physical Exam  Constitutional: She is oriented to person, place, and time. She appears well-developed and well-nourished. No distress.  HENT:  Head: Normocephalic and atraumatic.  Right Ear: External ear normal.  Left Ear: External ear normal.  Eyes: Conjunctivae are normal. Pupils are equal, round, and reactive to light. Right eye exhibits no discharge. Left eye exhibits no discharge. No scleral icterus.  Neck: Normal range of motion.  Cardiovascular: Normal rate, regular rhythm and normal heart sounds.   Respiratory: Effort normal and breath sounds normal. No respiratory distress.  Musculoskeletal: Normal range of motion.  Neurological: She is alert and oriented to person, place, and time.  Skin: Skin is warm and dry. Bruising noted. She is not diaphoretic.     Noted bruising on bilateral forearms. Noted several superficial horizontal and vertical cuts on left wrist.  Psychiatric: Her mood appears anxious. Her affect is blunt. She is withdrawn. She is not actively hallucinating. Thought content is not paranoid and not delusional. Cognition and memory are normal. She expresses impulsivity and inappropriate  judgment. She exhibits a depressed mood. She expresses suicidal ideation. She expresses no homicidal ideation. She is inattentive.    Review of Systems  Eyes: Negative for blurred vision.  Respiratory: Negative for shortness of breath.   Cardiovascular: Negative for chest pain and palpitations.  Gastrointestinal: Positive for diarrhea. Negative for nausea and vomiting.  Neurological: Negative for dizziness.  Psychiatric/Behavioral: Positive for depression, substance abuse and suicidal ideas. Negative for hallucinations and memory loss. The patient is nervous/anxious and has insomnia.   All other systems reviewed and are negative.   Blood pressure 122/76, pulse 95, temperature 98.9 F (37.2 C), resp. rate 18, SpO2 99 %, unknown if currently breastfeeding.There is no height or weight on file to calculate BMI.  General Appearance: Disheveled  Eye Contact:  Fair  Speech:  Normal Rate  Volume:  Normal  Mood:  Anxious, Depressed, Hopeless, Irritable and Worthless  Affect:  Depressed  Thought Process:  Coherent  Orientation:  Full (Time, Place, and Person)  Thought Content:  Logical  Suicidal Thoughts:  Yes.  with intent/plan  Homicidal Thoughts:  No  Memory:  Immediate;   Fair Recent;   Fair Remote;   Fair  Judgement:  Poor  Insight:  Lacking  Psychomotor Activity:  Decreased  Concentration: Concentration: Fair and Attention Span: Fair  Recall:  [employer] of Knowledge:Fair  Language: Good  Akathisia:  NA  Handed:  Right  AIMS (if indicated):     Assets:  Communication Skills Desire for Improvement Housing Physical Health  Sleep:       Musculoskeletal: Strength & Muscle Tone: within normal limits Gait & Station: normal Patient leans: N/A  Blood pressure 122/76,  pulse 95, temperature 98.9 F (37.2 C), resp. rate 18, SpO2 99 %, unknown if currently breastfeeding.  Recommendations:  Based on my evaluation the patient does not appear to have an emergency medical condition.  [facility].  Jackelyn PolingJason A Carmin Dibartolo, NP 01/18/2016, 2:15 AM

## 2016-01-19 DIAGNOSIS — F322 Major depressive disorder, single episode, severe without psychotic features: Principal | ICD-10-CM

## 2016-01-19 DIAGNOSIS — Z801 Family history of malignant neoplasm of trachea, bronchus and lung: Secondary | ICD-10-CM

## 2016-01-19 DIAGNOSIS — Z803 Family history of malignant neoplasm of breast: Secondary | ICD-10-CM

## 2016-01-19 DIAGNOSIS — Z79899 Other long term (current) drug therapy: Secondary | ICD-10-CM

## 2016-01-19 MED ORDER — ONDANSETRON 4 MG PO TBDP: 4.0000 mg | ORAL_TABLET | Freq: Four times a day (QID) | ORAL | Status: DC | PRN

## 2016-01-19 MED ORDER — HYDROXYZINE HCL 25 MG PO TABS
25.0000 mg | ORAL_TABLET | Freq: Four times a day (QID) | ORAL | Status: DC | PRN
  Administered 2016-01-21: 25 mg via ORAL
  Filled 2016-01-19: qty 10
  Filled 2016-01-19: qty 1

## 2016-01-19 MED ORDER — ADULT MULTIVITAMIN W/MINERALS CH
1.0000 | ORAL_TABLET | Freq: Every day | ORAL | Status: DC
  Administered 2016-01-19 – 2016-01-22 (×4): 1 via ORAL
  Filled 2016-01-19 (×7): qty 1

## 2016-01-19 MED ORDER — LORAZEPAM 1 MG PO TABS: 1.0000 mg | ORAL_TABLET | Freq: Four times a day (QID) | ORAL | Status: DC | PRN

## 2016-01-19 MED ORDER — TRAZODONE HCL 50 MG PO TABS
50.0000 mg | ORAL_TABLET | Freq: Every day | ORAL | Status: DC
  Filled 2016-01-19 (×2): qty 1

## 2016-01-19 MED ORDER — TRAZODONE HCL 50 MG PO TABS
50.0000 mg | ORAL_TABLET | Freq: Every evening | ORAL | Status: DC | PRN
  Administered 2016-01-19 – 2016-01-21 (×3): 50 mg via ORAL
  Filled 2016-01-19: qty 7
  Filled 2016-01-19 (×3): qty 1

## 2016-01-19 MED ORDER — VITAMIN B-1 100 MG PO TABS
100.0000 mg | ORAL_TABLET | Freq: Every day | ORAL | Status: DC
  Administered 2016-01-19 – 2016-01-22 (×4): 100 mg via ORAL
  Filled 2016-01-19 (×7): qty 1

## 2016-01-19 MED ORDER — THIAMINE HCL 100 MG/ML IJ SOLN: 100.0000 mg | Freq: Once | INTRAMUSCULAR | Status: DC

## 2016-01-19 MED ORDER — LOPERAMIDE HCL 2 MG PO CAPS: 2.0000 mg | ORAL_CAPSULE | ORAL | Status: DC | PRN

## 2016-01-19 MED ORDER — VITAMIN B-1 100 MG PO TABS
100.0000 mg | ORAL_TABLET | Freq: Every day | ORAL | Status: DC
  Filled 2016-01-19: qty 1

## 2016-01-19 NOTE — Progress Notes (Signed)
Adult Psychoeducational Group Note  Date:  01/19/2016 Time:  11:19 AM  Group Topic/Focus:  Coping With Mental Health Crisis:   The purpose of this group is to help patients identify strategies for coping with mental health crisis.  Group discusses possible causes of crisis and ways to manage them effectively.   Participation Level:  None  Participation Quality:  Inattentive  Affect:  Depressed  Cognitive:  Lacking  Insight: None  Engagement in Group:  None  Modes of Intervention:  Discussion  Additional Comments:  Pt did attend group but did not participate, pt stated that she did not want to talk about it and seemed very depressed. Azalee Weimer R Milfred Krammes 01/19/2016, 11:19 AM

## 2016-01-19 NOTE — Progress Notes (Signed)
D:  Patient's self inventory sheet, patient has fair sleep, no sleep medication.  Fair appetite, normal energy level, good concentration.  Rated depression, hopeless 2, anxiety 1.  Denied withdrawals.  Checked cravings, agitation.  Denied SI.  Denied physical problems.  Denied pain.  Goal is work on feelings/mood.  Plans to talk.  No discharge plans. A:  Medications administered per MD orders.  Emotional support and encouragement given patient. R:  Denied SI and HI, contracts for safety.  Denied A/V hallucinations.  Safety maintained with 15 minute checks.

## 2016-01-19 NOTE — Plan of Care (Signed)
Problem: Education: Goal: Utilization of techniques to improve thought processes will improve Outcome: Progressing Nurse discussed depression/coping skills with patient.    
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
## 2016-01-19 NOTE — Progress Notes (Signed)
Pt is on unit in day room participating in group.  Pt sts she is part [place] and part Cong[place]inese.  Pt sts she had a better day today.  Pt currently denies SI, HI or AVH.  Pt denies any pain of discomfort.  Pt verbally contracts for safety.  Pt takes all meds and is friendly and cooperative.  Pt has urine collection which was completed and after collection advised that she had her "period" today. Pt offer support and encouragement. Pt remains safe on unit

## 2016-01-19 NOTE — Progress Notes (Signed)
D: Pt denies SI/HI/AVH. Pt is pleasant and cooperative. Pt goal for today is to work on depression and identify causes of it. A: Pt was offered support and encouragement. Pt was given scheduled medications. Pt was encourage to attend groups. Q 15 minute checks were done for safety.  R:Pt attends groups and interacts well with peers and staff. Pt is taking medication. Pt has no complaints.Pt receptive to treatment and safety maintained on unit.

## 2016-01-19 NOTE — [facility] Group Notes (Signed)
[facility] LCSW Group Therapy  01/19/2016 1:15pm  Type of Therapy:  Group Therapy vercoming Obstacles  Pt did not attend, declined invitation.    Vernie ShanksLauren Demani Mcbrien, LCSW 01/19/2016 6:07 PM

## 2016-01-19 NOTE — Progress Notes (Signed)
Recreation Therapy Notes  Date: 01/19/16 Time: 0930 Location: [address] Dayroom  Group Topic: Stress Management  Goal Area(s) Addresses:  Patient will verbalize importance of using healthy stress management.  Patient will identify positive emotions associated with healthy stress management.   Intervention: Stress Management  Activity :  Peaceful Waves Guided Imagery.  LRT introduced the stress management concept of guided imagery.  LRT read a script to allow patients the opportunity engage and participate in the activity.  Patients were to follow along as LRT read script to participate in the activity.  Education:  Stress Management, Discharge Planning.   Education Outcome: Acknowledges edcuation/In group clarification offered/Needs additional education  Clinical Observations/Feedback: Pt did not attend group.   Elio Haden, LRT/CTRS         Gennell How A 01/19/2016 12:20 PM 

## 2016-01-19 NOTE — Progress Notes (Signed)
Adult Psychoeducational Group Note  Date:  01/19/2016 Time:  9:55 PM  Group Topic/Focus:  Wrap-Up Group:   The focus of this group is to help patients review their daily goal of treatment and discuss progress on daily workbooks.   Participation Level:  Active  Participation Quality:  Attentive  Affect:  Appropriate  Cognitive:  Appropriate  Insight: Good  Engagement in Group:  Engaged  Modes of Intervention:  Activity  Additional Comments:  Patient rated her day a 7. Goal is to get back refocused. Natasha MeadKiara M Tyquez Hollibaugh 01/19/2016, 9:55 PM

## 2016-01-19 NOTE — Tx Team (Signed)
Interdisciplinary Treatment and Diagnostic Plan Update  01/19/2016 Time of Session: 6:07 PM  Carolyn Barajas MRN: [id number]  Principal Diagnosis: Major depressive disorder, single episodes, severe without psychosis  Secondary Diagnoses: Active Problems:   Major depressive disorder, single episode, severe without psychotic features ([place])   Current Medications:  Current Facility-Administered Medications  Medication Dose Route Frequency Provider Last Rate Last Dose  . acetaminophen (TYLENOL) tablet 650 mg  650 mg Oral Q6H PRN Patrecia Pour, NP      . alum & mag hydroxide-simeth (MAALOX/MYLANTA) 200-200-20 MG/5ML suspension 30 mL  30 mL Oral Q4H PRN Patrecia Pour, NP      . citalopram (CELEXA) tablet 10 mg  10 mg Oral Daily Patrecia Pour, NP   10 mg at 01/19/16 6389  . hydrOXYzine (ATARAX/VISTARIL) tablet 25 mg  25 mg Oral Q6H PRN Patrecia Pour, NP      . magnesium hydroxide (MILK OF MAGNESIA) suspension 30 mL  30 mL Oral Daily PRN Patrecia Pour, NP      . traZODone (DESYREL) tablet 50 mg  50 mg Oral QHS Encarnacion Slates, NP        PTA Medications: Prescriptions Prior to Admission  Medication Sig Dispense Refill Last Dose  . acetaminophen (TYLENOL) 500 MG tablet Take 1 tablet (500 mg total) by mouth every 6 (six) hours as needed. (Patient not taking: Reported on 01/19/2016) 30 tablet 0 Not Taking at Unknown time    Treatment Modalities: Medication Management, Group therapy, Case management,  1 to 1 session with clinician, Psychoeducation, Recreational therapy.  Patient Stressors:    Patient Strengths: Ability for insight Average or above average intelligence Capable of independent living Curator fund of knowledge Motivation for treatment/growth  Physician Treatment Plan for Primary Diagnosis: Major depressive disorder, single episodes, severe without psychosis Long Term Goal(s): Improvement in symptoms so as ready for discharge  Short Term Goals: Ability to  verbalize feelings will improve Ability to disclose and discuss suicidal ideas Ability to demonstrate self-control will improve Ability to identify and develop effective coping behaviors will improve Ability to identify changes in lifestyle to reduce recurrence of condition will improve Ability to demonstrate self-control will improve Ability to identify and develop effective coping behaviors will improve Compliance with prescribed medications will improve Ability to identify triggers associated with substance abuse/mental health issues will improve  Medication Management: Evaluate patient's response, side effects, and tolerance of medication regimen.  Therapeutic Interventions: 1 to 1 sessions, Unit Group sessions and Medication administration.  Evaluation of Outcomes: Not Met  Physician Treatment Plan for Secondary Diagnosis: Active Problems:   Major depressive disorder, single episode, severe without psychotic features ([place])   Long Term Goal(s): Improvement in symptoms so as ready for discharge  Short Term Goals: Ability to verbalize feelings will improve Ability to disclose and discuss suicidal ideas Ability to demonstrate self-control will improve Ability to identify and develop effective coping behaviors will improve Ability to identify changes in lifestyle to reduce recurrence of condition will improve Ability to demonstrate self-control will improve Ability to identify and develop effective coping behaviors will improve Compliance with prescribed medications will improve Ability to identify triggers associated with substance abuse/mental health issues will improve  Medication Management: Evaluate patient's response, side effects, and tolerance of medication regimen.  Therapeutic Interventions: 1 to 1 sessions, Unit Group sessions and Medication administration.  Evaluation of Outcomes: Not Met   RN Treatment Plan for Primary Diagnosis: Major depressive disorder, single  episodes, severe  without psychosis Long Term Goal(s): Knowledge of disease and therapeutic regimen to maintain health will improve  Short Term Goals: Ability to verbalize feelings will improve, Ability to disclose and discuss suicidal ideas and Ability to identify and develop effective coping behaviors will improve  Medication Management: RN will administer medications as ordered by provider, will assess and evaluate patient's response and provide education to patient for prescribed medication. RN will report any adverse and/or side effects to prescribing provider.  Therapeutic Interventions: 1 on 1 counseling sessions, Psychoeducation, Medication administration, Evaluate responses to treatment, Monitor vital signs and CBGs as ordered, Perform/monitor CIWA, COWS, AIMS and Fall Risk screenings as ordered, Perform wound care treatments as ordered.  Evaluation of Outcomes: Not Met   LCSW Treatment Plan for Primary Diagnosis: Major depressive disorder, single episodes, severe without psychosis Long Term Goal(s): Safe transition to appropriate next level of care at discharge, Engage patient in therapeutic group addressing interpersonal concerns.  Short Term Goals: Engage patient in aftercare planning with referrals and resources, Identify triggers associated with mental health/substance abuse issues and Increase skills for wellness and recovery  Therapeutic Interventions: Assess for all discharge needs, 1 to 1 time with Social worker, Explore available resources and support systems, Assess for adequacy in community support network, Educate family and significant other(s) on suicide prevention, Complete Psychosocial Assessment, Interpersonal group therapy.  Evaluation of Outcomes: Not Met   Progress in Treatment: Attending groups: Pt is new to milieu, continuing to assess  Participating in groups: Pt is new to milieu, continuing to assess  Taking medication as prescribed: Yes, MD continues to  assess for medication changes as needed Toleration medication: Yes, no side effects reported at this time Family/Significant other contact made: No, CSW assessing for appropriate contact Patient understands diagnosis: Continuing to assess Discussing patient identified problems/goals with staff: Yes Medical problems stabilized or resolved: Yes Denies suicidal/homicidal ideation: Yes Issues/concerns per patient self-inventory: None Other: N/A  New problem(s) identified: None identified at this time.   New Short Term/Long Term Goal(s): None identified at this time.   Discharge Plan or Barriers: Pt will return home and follow-up with outpatient services.   Reason for Continuation of Hospitalization: Anxiety Depression Medication stabilization Suicidal ideation  Estimated Length of Stay: 3-5 days  Attendees: Patient: 01/19/2016  6:07 PM  Physician: Dr. Parke Poisson 01/19/2016  6:07 PM  Nursing:  01/19/2016  6:07 PM  RN Care Manager: Lars Pinks, RN 01/19/2016  6:07 PM  Social Worker: Adriana Reams, LCSW; Erasmo Downer Drinkard, LCSW 01/19/2016  6:07 PM  Recreational Therapist:  01/19/2016  6:07 PM  Other: Lindell Spar, NP; Samuel Jester, NP 01/19/2016  6:07 PM  Other:  01/19/2016  6:07 PM  Other: 01/19/2016  6:07 PM    Scribe for Treatment Team: Gladstone Lighter, LCSW 01/19/2016 6:07 PM

## 2016-01-20 LAB — TSH: TSH: 1.924 u[IU]/mL (ref 0.350–4.500)

## 2016-01-20 LAB — PREGNANCY, URINE: Preg Test, Ur: NEGATIVE

## 2016-01-20 NOTE — [facility] Group Notes (Signed)

## 2016-01-20 NOTE — [facility] Group Notes (Signed)
[facility] LCSW Group Therapy 01/20/2016 1:15 PM  Type of Therapy: Group Therapy- Feelings about Diagnosis  Pt did not attend, declined invitation.   Vernie ShanksLauren Elohim Brune, LCSW 01/20/2016 4:27 PM

## 2016-01-20 NOTE — [facility] Group Notes (Signed)
Pt attended spiritual care group on grief and loss facilitated by chaplain Burnis KingfisherMatthew Klayton Monie   Group opened with brief discussion and psycho-social ed around grief and loss in relationships and in relation to self - identifying life patterns, circumstances, changes that cause losses. Established group norm of speaking from own life experience. Group goal of establishing open and affirming space for members to share loss and experience with grief, normalize grief experience and provide psycho social education and grief support.     Synetta Failnita was present throughout group.   She did not engage in group discussion

## 2016-01-20 NOTE — Progress Notes (Addendum)
[facility] MD Progress Note  01/20/2016 3:07 PM Carolyn Barajas  MRN:  [id number] Subjective:  Patient states she is feeling better. At this time denies suicidal ideations. Denies medication side effects. Objective : I have discussed case with treatment team and have met with patient . She presents partially improved compared to admission presentation, but is still presenting depressed , constricted in affect, with fair eye contact . She is currently not endorsing symptoms of withdrawal and does not appear to be in any acute distress - no restlessness, no distal tremors, no diaphoresis, vitals stable She denies suicidal ideations. She denies medication side effects. No current symptoms of WDL- no tremors, no diaphoresis, no agitation or restlessness, vitals stable. She has been visible on unit, and has been going to groups . No agitated or disruptive behaviors on unit. Labs- TSH WNL, Pregnancy test negative.   Principal Problem: Major depressive disorder, single episode, severe without psychotic features ([place]) Diagnosis:   Patient Active Problem List   Diagnosis Date Noted  . Major depressive disorder, single episode, severe without psychotic features ([place]) [F32.2] 01/18/2016   Total Time spent with patient: 20 minutes   Past Medical History: History reviewed. No pertinent past medical history. History reviewed. No pertinent surgical history. Family History:  Family History  Problem Relation Age of Onset  . Breast cancer Mother   . Cancer Mother     lung    Social History:  History  Alcohol Use  . 4.2 oz/week  . 7 Cans of beer per week     History  Drug Use  . Types: Marijuana, Cocaine    Social History   Social History  . Marital status: Single    Spouse name: N/A  . Number of children: N/A  . Years of education: N/A   Social History Main Topics  . Smoking status: Current Every Day Smoker    Packs/day: 1.00  . Smokeless tobacco: Current User  . Alcohol use 4.2 oz/week     7 Cans of beer per week  . Drug use:     Types: Marijuana, Cocaine  . Sexual activity: Yes    Birth control/ protection: None   Other Topics Concern  . None   Social History Narrative  . None   Additional Social History:    Pain Medications: unknown  Prescriptions: unknown Over the Counter: unknown  History of alcohol / drug use?: (S) Yes Negative Consequences of Use: Personal relationships, Work / Youth worker, Museum/gallery curator Withdrawal Symptoms: Diarrhea, Agitation Name of Substance 1: Alcohol  1 - Age of First Use: teenager 1 - Amount (size/oz): multiple drinks  1 - Frequency: daily 1 - Duration: ongoing 1 - Last Use / Amount: 01/17/16 Name of Substance 2: Marijuana  2 - Age of First Use: 17  2 - Amount (size/oz): 3 blunts 2 - Frequency: weekly 2 - Duration: ongoing 2 - Last Use / Amount: last week  Name of Substance 3: Cocaine 3 - Age of First Use: 24 3 - Amount (size/oz): at least a gram 3 - Frequency: on occasions; past 2 weeks/daily 3 - Duration: ongoing  3 - Last Use / Amount: 01/17/16  Sleep: Good  Appetite:  Good  Current Medications: Current Facility-Administered Medications  Medication Dose Route Frequency Provider Last Rate Last Dose  . acetaminophen (TYLENOL) tablet 650 mg  650 mg Oral Q6H PRN Patrecia Pour, NP      . alum & mag hydroxide-simeth (MAALOX/MYLANTA) 200-200-20 MG/5ML suspension 30 mL  30 mL Oral  Q4H PRN Patrecia Pour, NP      . citalopram (CELEXA) tablet 10 mg  10 mg Oral Daily Patrecia Pour, NP   10 mg at 01/20/16 0827  . hydrOXYzine (ATARAX/VISTARIL) tablet 25 mg  25 mg Oral Q6H PRN Jenne Campus, MD      . loperamide (IMODIUM) capsule 2-4 mg  2-4 mg Oral PRN Jenne Campus, MD      . LORazepam (ATIVAN) tablet 1 mg  1 mg Oral Q6H PRN Myer Peer Cobos, MD      . magnesium hydroxide (MILK OF MAGNESIA) suspension 30 mL  30 mL Oral Daily PRN Patrecia Pour, NP      . multivitamin with minerals tablet 1 tablet  1 tablet Oral Daily Jenne Campus, MD   1 tablet at 01/20/16 0827  . ondansetron (ZOFRAN-ODT) disintegrating tablet 4 mg  4 mg Oral Q6H PRN Jenne Campus, MD      . thiamine (VITAMIN B-1) tablet 100 mg  100 mg Oral Daily Jenne Campus, MD   100 mg at 01/20/16 0827  . traZODone (DESYREL) tablet 50 mg  50 mg Oral QHS PRN Jenne Campus, MD   50 mg at 01/19/16 2235    Lab Results:  Results for orders placed or performed during the hospital encounter of 01/18/16 (from the past 48 hour(s))  Pregnancy, urine     Status: None   Collection Time: 01/19/16  9:52 PM  Result Value Ref Range   Preg Test, Ur NEGATIVE NEGATIVE    Comment:        THE SENSITIVITY OF THIS METHODOLOGY IS >20 mIU/mL. Performed at [facility]   TSH     Status: None   Collection Time: 01/20/16  6:16 AM  Result Value Ref Range   TSH 1.924 0.350 - 4.500 uIU/mL    Comment: Performed by a 3rd Generation assay with a functional sensitivity of <=0.01 uIU/mL. Performed at [facility]     Blood Alcohol level:  Lab Results  Component Value Date   [facility] <5 38/10/1749    Metabolic Disorder Labs: No results found for: HGBA1C, MPG No results found for: PROLACTIN No results found for: CHOL, TRIG, HDL, CHOLHDL, VLDL, LDLCALC  Physical Findings: AIMS: Facial and Oral Movements Muscles of Facial Expression: None, normal Lips and Perioral Area: None, normal Jaw: None, normal Tongue: None, normal,Extremity Movements Upper (arms, wrists, hands, fingers): None, normal Lower (legs, knees, ankles, toes): None, normal, Trunk Movements Neck, shoulders, hips: None, normal, Overall Severity Severity of abnormal movements (highest score from questions above): None, normal Incapacitation due to abnormal movements: None, normal Patient's awareness of abnormal movements (rate only patient's report): No Awareness, Dental Status Current problems with teeth and/or dentures?: No Does patient usually wear dentures?: No   CIWA:  CIWA-Ar Total: 0 COWS:  COWS Total Score: 1  Musculoskeletal: Strength & Muscle Tone: within normal limits Gait & Station: normal Patient leans: N/A  Psychiatric Specialty Exam: Physical Exam  ROS no vomiting, no fever, no chills, no rash   Blood pressure 129/82, pulse 79, temperature 97.6 F (36.4 C), temperature source Oral, resp. rate 20, height 5' 2" (1.575 m), weight 81.6 kg (180 lb), last menstrual period 01/14/2016, SpO2 100 %, unknown if currently breastfeeding.Body mass index is 32.92 kg/m.  General Appearance: Fairly Groomed  Eye Contact:  Fair  Speech:  Normal Rate  Volume:  Decreased  Mood:  reports she is feeling better,  but still presents depressed   Affect:  remains relatively blunted and constricted, but does smile briefly at times   Thought Process:  Linear  Orientation:  Full (Time, Place, and Person)  Thought Content:  denies hallucinations, no delusions, not internally preoccupied   Suicidal Thoughts:  No at this time denies any suicidal or self injurious ideations, denies any homicidal ideations   Homicidal Thoughts:  No  Memory:  recent and remote grossly intact   Judgement:  Fair  Insight:  Fair  Psychomotor Activity:  Normal  Concentration:  Concentration: Good and Attention Span: Good  Recall:  Good  Fund of Knowledge:  Good  Language:  Good  Akathisia:  Negative  Handed:  Right  AIMS (if indicated):     Assets:  Desire for Improvement Physical Health Resilience  ADL's:  Intact  Cognition:  WNL  Sleep:  Number of Hours: 6.75   Assessment -  26 year old single female, employed. Recent impulsive overdose on herbal supplement . Reports relationship stressors. Presents with bilateral bruises to forearms but does not elaborate and tends to be somewhat guarded. She reports she is feeling better, denies suicidal ideations. Affect is still restricted, but somewhat improved compared to admission . Thus far tolerating Celexa trial well .    Treatment Plan Summary: Daily contact with patient to assess and evaluate symptoms and progress in treatment, Medication management, Plan inpatient admission  and medications as below Encourage group and milieu participation  Treatment team working on disposition planning  Continue Celexa 10 mgrs QDAY for depression, anxiety Continue Vistaril 25 mgrs Q 6 hours PRN for anxiety Continue Trazodone 50 mgrs QHS PRN for insomnia Due to recent overdose on a herbal supplement , will recheck BMP and Liver function test to monitor.   Neita Garnet, MD 01/20/2016, 3:07 PM

## 2016-01-20 NOTE — Progress Notes (Signed)
D:  Patient's self inventory sheet, patient has fair sleep, sleep medication is helpful.  Fair appetite, normal energy level, good concentration.  Rated depression and hopeless #2, anxiety #1.  Denied withdrawals.  Denied SI.  Denied physical problems.  Denied physical pain.  Goal is keep mind clear (positive thoughts).  Plans to get involved.  No discharge plans. A:  Medications administered per MD orders.  Emotional support and encouragement given patient. R:  Denied SI and HI, contracts for safety.   Denied A/V hallucinations.  Safety maintained with 15 minute checks.

## 2016-01-20 NOTE — [facility] Suicide Risk Assessment (Signed)
[facility] INPATIENT:  Family/Significant Other Suicide Prevention Education  Suicide Prevention Education:  Patient Refusal for Family/Significant Other Suicide Prevention Education: The patient Carolyn Barajas has refused to provide written consent for family/significant other to be provided Family/Significant Other Suicide Prevention Education during admission and/or prior to discharge.  Physician notified.  Verdene LennertLauren C Dickie Cloe 01/20/2016, 4:59 PM

## 2016-01-20 NOTE — Progress Notes (Signed)
Recreation Therapy Notes  Animal-Assisted Activity (AAA) Program Checklist/Progress Notes Patient Eligibility Criteria Checklist & Daily Group note for Rec TxIntervention  Date: 01.16.2018 Time: 2:45pm Location: [address] Dayroom    AAA/T Program Assumption of Risk Form signed by Patient/ or Parent Legal Guardian Yes  Patient is free of allergies or sever asthma Yes  Patient reports no fear of animals Yes  Patient reports no history of cruelty to animals Yes  Patient understands his/her participation is voluntary Yes  Patient washes hands before animal contact Yes  Patient washes hands after animal contact Yes  Behavioral Response: Engaged, Appropriate   Education:Hand Washing, Appropriate Animal Interaction   Education Outcome: Acknowledges education.   Clinical Observations/Feedback: Patient attended session and interacted appropriately with therapy dog and peers.  Carolyn Barajas, LRT/CTRS        Carolyn Barajas L 01/20/2016 3:10 PM 

## 2016-01-20 NOTE — Progress Notes (Signed)
Adult Psychoeducational Group Note  Date:  01/20/2016 Time:  10:27 PM  Group Topic/Focus:  Wrap-Up Group:   The focus of this group is to help patients review their daily goal of treatment and discuss progress on daily workbooks.   Participation Level:  Active  Participation Quality:  Appropriate  Affect:  Appropriate  Cognitive:  Alert  Insight: Appropriate  Engagement in Group:  Engaged  Modes of Intervention:  Discussion  Additional Comments:  Patient states, "I had a good day". Patient's goal for today was to come up with a plan following discharge.  Carolyn Barajas L Natally Ribera 01/20/2016, 10:27 PM

## 2016-01-20 NOTE — Plan of Care (Signed)
Problem: Education: Goal: Knowledge of the prescribed therapeutic regimen will improve Outcome: Progressing Nurse discussed depression/coping skills with patient.        

## 2016-01-21 LAB — COMPREHENSIVE METABOLIC PANEL
ALT: 17 U/L (ref 14–54)
AST: 15 U/L (ref 15–41)
Albumin: 3.9 g/dL (ref 3.5–5.0)
Alkaline Phosphatase: 48 U/L (ref 38–126)
Anion gap: 8 (ref 5–15)
BUN: 12 mg/dL (ref 6–20)
CO2: 24 mmol/L (ref 22–32)
Calcium: 9.1 mg/dL (ref 8.9–10.3)
Chloride: 105 mmol/L (ref 101–111)
Creatinine, Ser: 0.65 mg/dL (ref 0.44–1.00)
GFR calc Af Amer: >60 mL/min (ref >60)
GFR calc non Af Amer: >60 mL/min (ref >60)
Glucose, Bld: 82 mg/dL (ref 65–99)
Potassium: 3.8 mmol/L (ref 3.5–5.1)
Sodium: 137 mmol/L (ref 135–145)
Total Bilirubin: 0.3 mg/dL (ref 0.3–1.2)
Total Protein: 7.2 g/dL (ref 6.5–8.1)

## 2016-01-21 NOTE — Progress Notes (Signed)
D: Patient is pleasant upon approach. She is observed in the day room interacting with her peers. Her affect is brighter since admission and she reports decreased depressive symptoms.  She has been attending groups and participating.  Patient appears to have good insight regarding her treatment and reports some changes she is attempting to make in her life.  She denies any thoughts of self harm.   A: Continue to monitor medication management and MD orders.  Safety checks completed every 15 minutes per protocol.  Offer support and encouragement as needed. R: Patient is receptive to staff; her behavior is appropriate.
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
## 2016-01-21 NOTE — Progress Notes (Signed)
Pt reports she had a very good day.  She feels she is "back to normal" and that she could be discharged if the doctor will allow it.  She feels the medications are working for her.  She denies SI/HI/AVH.  Pt did request a sleep aid for bedtime which she was given.  Pt has been polite and cooperative with staff.  She has had minimal interaction with the other patients.  Support and encouragement offered.  Pt was encouraged to make her needs known to staff.  She was also encouraged to continue to take her medications even when she feels better as that is an indication that the med is working.  Discharge plans are in process.  Safety maintained with q15 minute checks.

## 2016-01-21 NOTE — Plan of Care (Signed)
Problem: Activity: Goal: Sleeping patterns will improve Outcome: Progressing Pt reports that she slept good last night  Problem: Education: Goal: Knowledge of the prescribed therapeutic regimen will improve Outcome: Progressing Pt is taking medications as prescribed  Problem: Safety: Goal: Ability to disclose and discuss suicidal ideas will improve Outcome: Progressing Pt denies si thoughts and discussed stressors

## 2016-01-21 NOTE — Progress Notes (Signed)
Nursing Progress Note: 7p-7a D: Pt currently presents with a anxious affect and behavior. Pt states "I have had a good day today. Learned a lot." Interacting minimally with milieu. Pt reports good sleep with current medication regimen.   A: Pt provided with medications per providers orders. Pt's labs and vitals were monitored throughout the night. Pt supported emotionally and encouraged to express concerns and questions. Pt educated on medications.  R: Pt's safety ensured with 15 minute and environmental checks. Pt currently denies SI/HI/Self Harm and A/V hallucinations. Pt verbally contracts to seek staff if SI/HI or A/VH occurs and to consult with staff before acting on any harmful thoughts. Will continue to monitor.
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
## 2016-01-22 MED ORDER — CITALOPRAM HYDROBROMIDE 10 MG PO TABS: 10.0000 mg | ORAL_TABLET | Freq: Every day | ORAL | 0 refills | Status: AC

## 2016-01-22 MED ORDER — TRAZODONE HCL 50 MG PO TABS: 50.0000 mg | ORAL_TABLET | Freq: Every evening | ORAL | 0 refills | Status: AC | PRN

## 2016-01-22 MED ORDER — HYDROXYZINE HCL 25 MG PO TABS: ORAL_TABLET | ORAL | 0 refills | Status: AC

## 2016-01-22 MED ORDER — ACETAMINOPHEN 500 MG PO TABS: 500.0000 mg | ORAL_TABLET | Freq: Four times a day (QID) | ORAL | 0 refills | Status: AC | PRN

## 2016-01-22 NOTE — [facility] Suicide Risk Assessment (Addendum)
[facility]   Principal Problem: Major depressive disorder, single episode, severe without psychotic features [facility](HCC) Discharge Diagnoses:  Patient Active Problem List   Diagnosis Date Noted  . Major depressive disorder, single episode, severe without psychotic features ([facility]) [F32.2] 01/18/2016    Total Time spent with patient: 30 minutes  Musculoskeletal: Strength & Muscle Tone: within normal limits Gait & Station: normal Patient leans: N/A  Psychiatric Specialty Exam: ROS denies headache, no chest pain, no shortness of breath, no nausea or vomiting   Blood pressure 124/69, pulse 68, temperature 97.5 F (36.4 C), resp. rate 16, height 5\' 2"  (1.575 m), weight 81.6 kg (180 lb), last menstrual period 01/14/2016, SpO2 100 %, unknown if currently breastfeeding.Body mass index is 32.92 kg/m.  General Appearance: Well Groomed  Eye Contact::  Good  Speech:  Normal Rate409  Volume:  Normal  Mood:  reports her mood is improved, and currently denies feeling depressed   Affect:  Appropriate and more reactive and less anxious [place] on admission  Thought Process:  Linear  Orientation:  Full (Time, Place, and Person)  Thought Content:  denies hallucinations, no delusions, not internally preoccupied   Suicidal Thoughts:  No denies any suicidal ideations, denies any self injurious ideations, no homicidal or violent ideations   Homicidal Thoughts:  No  Memory:  recent and remote grossly intact   Judgement:  Other:  improving   Insight:  improving   Psychomotor Activity:  Normal  Concentration:  Good  Recall:  Good  Fund of Knowledge:Good  Language: Good  Akathisia:  Negative  Handed:  Right  AIMS (if indicated):     Assets:  Desire for Improvement Resilience  Sleep:  Number of Hours: 6.25  Cognition: WNL  ADL's:  Intact   Mental Status Per Nursing Assessment::   On Admission:  Suicidal ideation indicated by patient  Demographic Factors:  26 year old single  female, no children, employed  Loss Factors: Job related stressors, limited sober support network, financial issues   Historical Factors: Has had one prior psychiatric admission for suicidal attempt by overdosing   Risk Reduction Factors:   Employed and Positive coping skills or problem solving skills  Continued Clinical Symptoms:  At this time patient is improved compared to admission, mood improved, affect fuller in range , no thought disorder, no suicidal or self injurious ideations, no hallucinations, no delusions,not internally preoccupied, future oriented, plans to return to work but start looking for another job.  Denies medication side effects Behavior on unit calm, in good control Denies any cravings for alcohol or illicit substances   Cognitive Features That Contribute To Risk:  .No gross cognitive deficits noted upon discharge. Is alert , attentive, and oriented x 3   Suicide Risk:  Mild:  Suicidal ideation of limited frequency, intensity, duration, and specificity.  There are no identifiable plans, no associated intent, mild dysphoria and related symptoms, good self-control (both objective and subjective assessment), few other risk factors, and identifiable protective factors, including available and accessible social support.  Follow-up Information    [facility] Follow up.   Specialty:  Behavioral Health Why:  Please go within 1-3 days of discharge to be assessed for medication management services. Walk-in hours are held from [employer]8am-3pm Monday-Friday Contact information: [address] [place] [address] [phone]        [facility] Follow up.   Why:  CSW will contact you by 1/22 with appointment date and time.  Contact  information: [facility] [address]  [address]  [place], [place] [address] PHONE: [phone] FAX: [phone]          Plan Of Care/Follow-up recommendations:  Activity:  as tolerated  Diet:   Regular Tests:  NA Other:  See below  Patient is leaving unit in good spirits Plans to follow up as above We discussed importance of maintaining sobriety, and recommended she consider 12 step program participation.  Nehemiah Massed, MD 01/22/2016, 1:08 PM

## 2016-01-22 NOTE — Progress Notes (Signed)
Patient ID: Carolyn Barajas, female   DOB: 01/19/1990, 26 y.o.   MRN: [id number]  Discharge Note: Pt. Denies SI/HI and A/V hallucinations. Belongings returned to patient at time of discharge. Patient denies any pain or discomfort. Discharge instructions and medications were reviewed with patient. Patient verbalized understanding of both medications and discharge instructions. Patient discharged to lobby with no distress. Q15 minute safety checks maintained until discharge.

## 2016-01-22 NOTE — Progress Notes (Addendum)
  [facility] :  Will you be returning to the same living situation after discharge:  Yes,  Pt returning home At discharge, do you have transportation home?: Yes,  Pt sister to pick up Do you have the ability to pay for your medications: Yes,  Pt provided with samples and prescriptions  Release of information consent forms completed and in the chart;  Patient's signature needed at discharge.  Patient to Follow up at: Follow-up Information    [facility] Follow up.   Specialty:  Behavioral Health Why:  Please go within 1-3 days of discharge to be assessed for medication management services. Walk-in hours are held from [employer]8am-3pm Monday-Friday Contact information: [address] [place] [address] [phone]        [facility] Follow up on 01/28/2016.   Why:  at 9:00am with Ale[facility] for therapy. Please arrive 15 minutes early to complete paperwork.  Contact information: [facility] [address]  [address]  [place], [place] [address] PHONE: [phone] FAX: [phone]          Next level of care provider [facility] Link:no  Safety Planning and Suicide Prevention discussed: Yes,  with Pt; declined family contact  Have you used any form of tobacco in the last 30 days? (Cigarettes, Smokeless Tobacco, Cigars, and/or Pipes): Yes  Has patient been referred to the Quitline?: Patient refused referral  Patient has been referred for addiction treatment: Yes  Verdene LennertLauren C Anabela Crayton 01/22/2016, 11:28 AM

## 2016-01-22 NOTE — Progress Notes (Signed)
Patient ID: Carolyn Barajas, female   DOB: 09/11/1990, 26 y.o.   MRN: [id number]  DAR: Pt. Denies SI/HI and A/V Hallucinations. She reports sleep is good, appetite is good, energy level is normal, and concentration is good. She rates depression 1/10, hopelessness 0/10, and anxiety 1/10.  Patient does not report any pain or discomfort at this time. Support and encouragement provided to the patient. Scheduled medications administered to patient per physician's orders. Patient denies side effects at this time. Patient is minimal with staff and her peers. She reports she is ready for discharge today. Q15 minute checks are maintained for safety.

## 2016-01-22 NOTE — Tx Team (Signed)
Interdisciplinary Treatment and Diagnostic Plan Update  01/22/2016 Time of Session: 11:20 AM  Carolyn Barajas MRN: [id number]  Principal Diagnosis: Major depressive disorder, single episodes, severe without psychosis  Secondary Diagnoses: Principal Problem:   Major depressive disorder, single episode, severe without psychotic features (HCC)   Current Medications:  Current Facility-Administered Medications  Medication Dose Route Frequency Provider Last Rate Last Dose  . acetaminophen (TYLENOL) tablet 650 mg  650 mg Oral Q6H PRN Charm Rings, NP      . alum & mag hydroxide-simeth (MAALOX/MYLANTA) 200-200-20 MG/5ML suspension 30 mL  30 mL Oral Q4H PRN Charm Rings, NP      . citalopram (CELEXA) tablet 10 mg  10 mg Oral Daily Charm Rings, NP   10 mg at 01/22/16 0758  . hydrOXYzine (ATARAX/VISTARIL) tablet 25 mg  25 mg Oral Q6H PRN Craige Cotta, MD   25 mg at 01/21/16 2122  . loperamide (IMODIUM) capsule 2-4 mg  2-4 mg Oral PRN Craige Cotta, MD      . LORazepam (ATIVAN) tablet 1 mg  1 mg Oral Q6H PRN Rockey Situ Cobos, MD      . magnesium hydroxide (MILK OF MAGNESIA) suspension 30 mL  30 mL Oral Daily PRN Charm Rings, NP      . multivitamin with minerals tablet 1 tablet  1 tablet Oral Daily Craige Cotta, MD   1 tablet at 01/22/16 0758  . ondansetron (ZOFRAN-ODT) disintegrating tablet 4 mg  4 mg Oral Q6H PRN Craige Cotta, MD      . thiamine (VITAMIN B-1) tablet 100 mg  100 mg Oral Daily Craige Cotta, MD   100 mg at 01/22/16 0758  . traZODone (DESYREL) tablet 50 mg  50 mg Oral QHS PRN Craige Cotta, MD   50 mg at 01/21/16 2122    PTA Medications: Prescriptions Prior to Admission  Medication Sig Dispense Refill Last Dose  . [DISCONTINUED] acetaminophen (TYLENOL) 500 MG tablet Take 1 tablet (500 mg total) by mouth every 6 (six) hours as needed. (Patient not taking: Reported on 01/19/2016) 30 tablet 0 Not Taking at Unknown time    Treatment Modalities: Medication  Management, Group therapy, Case management,  1 to 1 session with clinician, Psychoeducation, Recreational therapy.  Patient Stressors:    Patient Strengths: Ability for insight Average or above average intelligence Capable of independent living Wellsite geologist fund of knowledge Motivation for treatment/growth  Physician Treatment Plan for Primary Diagnosis: Major depressive disorder, single episodes, severe without psychosis Long Term Goal(s): Improvement in symptoms so as ready for discharge  Short Term Goals: Ability to verbalize feelings will improve Ability to disclose and discuss suicidal ideas Ability to demonstrate self-control will improve Ability to identify and develop effective coping behaviors will improve Ability to identify changes in lifestyle to reduce recurrence of condition will improve Ability to demonstrate self-control will improve Ability to identify and develop effective coping behaviors will improve Compliance with prescribed medications will improve Ability to identify triggers associated with substance abuse/mental health issues will improve  Medication Management: Evaluate patient's response, side effects, and tolerance of medication regimen.  Therapeutic Interventions: 1 to 1 sessions, Unit Group sessions and Medication administration.  Evaluation of Outcomes: Adequate for Discharge  Physician Treatment Plan for Secondary Diagnosis: Principal Problem:   Major depressive disorder, single episode, severe without psychotic features (HCC)   Long Term Goal(s): Improvement in symptoms so as ready for discharge  Short Term Goals: Ability to verbalize  feelings will improve Ability to disclose and discuss suicidal ideas Ability to demonstrate self-control will improve Ability to identify and develop effective coping behaviors will improve Ability to identify changes in lifestyle to reduce recurrence of condition will improve Ability to  demonstrate self-control will improve Ability to identify and develop effective coping behaviors will improve Compliance with prescribed medications will improve Ability to identify triggers associated with substance abuse/mental health issues will improve  Medication Management: Evaluate patient's response, side effects, and tolerance of medication regimen.  Therapeutic Interventions: 1 to 1 sessions, Unit Group sessions and Medication administration.  Evaluation of Outcomes: Adequate for Discharge   RN Treatment Plan for Primary Diagnosis: Major depressive disorder, single episodes, severe without psychosis Long Term Goal(s): Knowledge of disease and therapeutic regimen to maintain health will improve  Short Term Goals: Ability to verbalize feelings will improve, Ability to disclose and discuss suicidal ideas and Ability to identify and develop effective coping behaviors will improve  Medication Management: RN will administer medications as ordered by provider, will assess and evaluate patient's response and provide education to patient for prescribed medication. RN will report any adverse and/or side effects to prescribing provider.  Therapeutic Interventions: 1 on 1 counseling sessions, Psychoeducation, Medication administration, Evaluate responses to treatment, Monitor vital signs and CBGs as ordered, Perform/monitor CIWA, COWS, AIMS and Fall Risk screenings as ordered, Perform wound care treatments as ordered.  Evaluation of Outcomes: Adequate for Discharge   LCSW Treatment Plan for Primary Diagnosis: Major depressive disorder, single episodes, severe without psychosis Long Term Goal(s): Safe transition to appropriate next level of care at discharge, Engage patient in therapeutic group addressing interpersonal concerns.  Short Term Goals: Engage patient in aftercare planning with referrals and resources, Identify triggers associated with mental health/substance abuse issues and  Increase skills for wellness and recovery  Therapeutic Interventions: Assess for all discharge needs, 1 to 1 time with Social worker, Explore available resources and support systems, Assess for adequacy in community support network, Educate family and significant other(s) on suicide prevention, Complete Psychosocial Assessment, Interpersonal group therapy.  Evaluation of Outcomes: Adequate for Discharge   Progress in Treatment: Attending groups: Yes  Participating in groups: No Taking medication as prescribed: Yes, MD continues to assess for medication changes as needed Toleration medication: Yes, no side effects reported at this time Family/Significant other contact made: No, Pt declines Patient understands diagnosis: Minimal insight Discussing patient identified problems/goals with staff: Yes Medical problems stabilized or resolved: Yes Denies suicidal/homicidal ideation: Yes Issues/concerns per patient self-inventory: None Other: N/A  New problem(s) identified: None identified at this time.   New Short Term/Long Term Goal(s): None identified at this time.   Discharge Plan or Barriers: Pt will return home and follow-up with outpatient services.   Reason for Continuation of Hospitalization: None identified at this time.   Estimated Length of Stay: 0 days  Attendees: Patient: 01/22/2016  11:20 AM  Physician: Dr. Jama Flavorsobos 01/22/2016  11:20 AM  Nursing: Marzetta Boardhrista Dopson, RN 01/22/2016  11:20 AM  RN Care Manager: Onnie BoerJennifer Clark, RN 01/22/2016  11:20 AM  Social Worker: Vernie ShanksLauren Artavia Jeanlouis, LCSW 01/22/2016  11:20 AM  Recreational Therapist:  01/22/2016  11:20 AM  Other: Armandina StammerAgnes Nwoko, NP; Hillery Jacksanika Lewis, NP 01/22/2016  11:20 AM  Other:  01/22/2016  11:20 AM  Other: 01/22/2016  11:20 AM    Scribe for Treatment Team: Verdene LennertLauren C Imri Lor, LCSW 01/22/2016 11:20 AM

## 2016-01-22 NOTE — Discharge Summary (Signed)
Physician Discharge Summary Note  Patient:  Carolyn Barajas is an 26 y.o., female MRN:  [id number] DOB:  August 20, 1990 Patient phone:  [phone] (home)  Patient address:   [address] [place] [address],  Total Time spent with patient: [place] 30 minutes  Date of Admission:  01/18/2016  Date of Discharge: 01-22-16  Reason for Admission: Worsening symptoms of depression & suicide attempt by overdose.  Principal Problem: Major depressive disorder, single episode, severe without psychotic features [facility])  Discharge Diagnoses: Patient Active Problem List   Diagnosis Date Noted  . Major depressive disorder, single episode, severe without psychotic features (HCC) [F32.2] 01/18/2016   Past Psychiatric History: Cocaine & THC abuse, Major depressive disorder without psychotic features.  Past Medical History: History reviewed. No pertinent past medical history. History reviewed. No pertinent surgical history.  Family History:  Family History  Problem Relation Age of Onset  . Breast cancer Mother   . Cancer Mother     lung   Family Psychiatric  History: See H&P  Social History:  History  Alcohol Use  . 4.2 oz/week  . 7 Cans of beer per week     History  Drug Use  . Types: Marijuana, Cocaine    Social History   Social History  . Marital status: Single    Spouse name: N/A  . Number of children: N/A  . Years of education: N/A   Social History Main Topics  . Smoking status: Current Every Day Smoker    Packs/day: 1.00  . Smokeless tobacco: Current User  . Alcohol use 4.2 oz/week    7 Cans of beer per week  . Drug use: Yes    Types: Marijuana, Cocaine  . Sexual activity: Yes    Birth control/ protection: None   Other Topics Concern  . None   Social History Narrative  . None   Hospital Course:This is an admission assessment for this 26 year old Caucasian female. Admitted to the [facility] adult unit as a walk-in, however, was medically cleared at the  [facility] ED. She is being admitted to the [facility] adult unit with complaints of worsening symptoms of depression triggering suicidal ideation & attempt by overdose. During this assessment, [name] reports, "I walked-in to this hospital late Saturday night. On this day, I was not feeling my best. I did not go to work. I stayed at home, thinking about bad things to do to myself because I did not want to live any more. I have always felt this way while trying to make things work for this thing called life. I did make myself think that I could make life work-out for me, but I was wrong. I was messed up real bad when I was young. My mother died when I was 48 years old.  I could not finish high school. I have no family. So, on Saturday, I decided to cut myself on the left wrist, went to sleep. When I woke up, I took 2 bottles of Vitamins in an attempt to end my life, went to sleep. Then a friend came by & woke me up. I have never had any treatment for depression. I smoke weed on daily basis. 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 But, I can make myself stop using . I would like to try to take medication for depression if it will me".   Emer was admitted to the hospital for worsening symptoms of depression & crisis management due  to suicide attempt by overdose. She stated on admission that she has had a hard life & has been trying to make her life what while, but she could not see it happening. As a result, she decided to take her own life by taking an overdose of medications.   After her admission assessment, [name]'s presenting symptoms were identified. The medication regimen targeting those symptoms were initiated. She was medicated & discharged on; Citalopram 10 mg for depression, Hydroxyzine 25 mg prn for anxiety & Trazodone 50 mg for insomnia. She presented no other pre-existing medical problems that required treatment & or monitoring. She was enrolled & participated in the group counseling sessions  being offered & held on this unit. She learned coping skills..  During the course of her hospitalization, Sonjia's improvement was monitored by observation & her daily report of symptom reduction noted. Her emotional & mental status were monitored by daily self-inventory reports completed by her & the clinical staff. She was evaluated by the treatment team for mood stability & plans for continued recovery after discharge. Meital's motivation was an integral factor in her mood stability. She was offered further treatment options upon discharge & will follow-up care on an outpatient basis as noted below.   Upon discharge, [name] was both mentally & medically stable. She denies suicidal/homicidal ideations, auditory/visual/tactile hallucinations, delusional thoughts or paranoia. She received from the pharmacy, a 7 days worth, supply samples of her [facility] discharge medications. She left [facility] with all belongings in no distress. Transportation per sister.  Physical Findings: AIMS: Facial and Oral Movements Muscles of Facial Expression: None, normal Lips and Perioral Area: None, normal Jaw: None, normal Tongue: None, normal,Extremity Movements Upper (arms, wrists, hands, fingers): None, normal Lower (legs, knees, ankles, toes): None, normal, Trunk Movements Neck, shoulders, hips: None, normal, Overall Severity Severity of abnormal movements (highest score from questions above): None, normal Incapacitation due to abnormal movements: None, normal Patient's awareness of abnormal movements (rate only patient's report): No Awareness, Dental Status Current problems with teeth and/or dentures?: No Does patient usually wear dentures?: No  CIWA:  CIWA-Ar Total: 0 COWS:  COWS Total Score: 1  Musculoskeletal: Strength & Muscle Tone: within normal limits Gait & Station: normal Patient leans: N/A  Psychiatric Specialty Exam: Physical Exam  Constitutional: She is oriented to person, place, and time. She appears  well-developed.  HENT:  Head: Normocephalic.  Eyes: Pupils are equal, round, and reactive to light.  Neck: Normal range of motion.  Cardiovascular: Normal rate.   Respiratory: Effort normal.  GI: Soft.  Genitourinary:  Genitourinary Comments: Denies any issues in this area.  Musculoskeletal: Normal range of motion.  Neurological: She is alert and oriented to person, place, and time.  Skin: Skin is warm and dry.    Review of Systems  Constitutional: Negative.   HENT: Negative.   Eyes: Negative.   Respiratory: Negative.   Cardiovascular: Negative.   Gastrointestinal: Negative.   Genitourinary: Negative.   Musculoskeletal: Negative.   Skin: Negative.   Neurological: Negative.   Endo/Heme/Allergies: Negative.   Psychiatric/Behavioral: Positive for depression (Stable), hallucinations and substance abuse (Hx, Cocaine & THC use disorder). Negative for memory loss and suicidal ideas. The patient has insomnia (Stable). The patient is not nervous/anxious.     Blood pressure 124/69, pulse 68, temperature 97.5 F (36.4 C), resp. rate 16, height 5\' 2"  (1.575 m), weight 81.6 kg (180 lb), last menstrual period 01/14/2016, SpO2 100 %, unknown if currently breastfeeding.Body mass index is 32.92 kg/m.  See Md's  SRA   Have you used any form of tobacco in the last 30 days? (Cigarettes, Smokeless Tobacco, Cigars, and/or Pipes): Yes  Has this patient used any form of tobacco in the last 30 days? (Cigarettes, Smokeless Tobacco, Cigars, and/or Pipes): No  Blood Alcohol level:  Lab Results  Component Value Date   ETH <5 01/18/2016   Metabolic Disorder Labs:  No results found for: HGBA1C, MPG No results found for: PROLACTIN No results found for: CHOL, TRIG, HDL, CHOLHDL, VLDL, LDLCALC  See Psychiatric Specialty Exam and Suicide Risk Assessment completed by Attending Physician prior to discharge.  Discharge destination:  Home  Is patient on multiple antipsychotic therapies at discharge:  No    Has Patient had three or more failed trials of antipsychotic monotherapy by history:  No  Recommended Plan for Multiple Antipsychotic Therapies: NA  Allergies as of 01/22/2016   No Known Allergies     Medication List    TAKE these medications     Indication  acetaminophen 500 MG tablet Commonly known as:  TYLENOL Take 1 tablet (500 mg total) by mouth every 6 (six) hours as needed. For pain/fever. What changed:  additional instructions  Indication:  Fever, Pain   citalopram 10 MG tablet Commonly known as:  CELEXA Take 1 tablet (10 mg total) by mouth daily. For depression Start taking on:  01/23/2016  Indication:  Depression   hydrOXYzine 25 MG tablet Commonly known as:  ATARAX/VISTARIL Take 1 tablet (25 mg) Q 6 hours as needed: For anxiety  Indication:  Anxiety Neurosis   traZODone 50 MG tablet Commonly known as:  DESYREL Take 1 tablet (50 mg total) by mouth at bedtime as needed for sleep.  Indication:  Trouble Sleeping      Follow-up Information    [facility] Follow up.   Specialty:  Behavioral Health Why:  Please go within 1-3 days of discharge to be assessed for medication management services. Walk-in hours are held from [employer]8am-3pm Monday-Friday Contact information: [address] [place] [address] [phone]        [facility] Follow up.          Follow-up recommendations: Activity:  As tolerated Diet: As recommended by your primary care doctor. Keep all scheduled follow-up appointments as recommended.  Comments: Patient is instructed prior to discharge to: Take all medications as prescribed by his/her mental healthcare provider. Report any adverse effects and or reactions from the medicines to his/her outpatient provider promptly. Patient has been instructed & cautioned: To not engage in alcohol and or illegal drug use while on prescription medicines. In the event of worsening symptoms, patient is instructed to call the crisis  hotline, 911 and or go to the nearest ED for appropriate evaluation and treatment of symptoms. To follow-up with his/her primary care provider for your other medical issues, concerns and or health care needs.   Signed: Sanjuana KavaNwoko, Agnes I, NP, PMHNP, FNP-BC 01/22/2016, 10:53 AM   Patient seen, Suicide Assessment Completed.  Disposition Plan Reviewed

## 2016-11-20 ENCOUNTER — Encounter (HOSPITAL_BASED_OUTPATIENT_CLINIC_OR_DEPARTMENT_OTHER): Payer: Self-pay | Admitting: Emergency Medicine

## 2016-11-20 ENCOUNTER — Other Ambulatory Visit: Payer: Self-pay

## 2016-11-20 ENCOUNTER — Emergency Department (HOSPITAL_BASED_OUTPATIENT_CLINIC_OR_DEPARTMENT_OTHER)
Admission: EM | Admit: 2016-11-20 | Discharge: 2016-11-20 | Disposition: A | Discharge status: Home / Self Care | Payer: Self-pay | Attending: Emergency Medicine | Admitting: Emergency Medicine

## 2016-11-20 DIAGNOSIS — F17210 Nicotine dependence, cigarettes, uncomplicated: Secondary | ICD-10-CM | POA: Insufficient documentation

## 2016-11-20 DIAGNOSIS — J020 Streptococcal pharyngitis: Secondary | ICD-10-CM | POA: Insufficient documentation

## 2016-11-20 LAB — RAPID STREP SCREEN (MED CTR MEBANE ONLY): Streptococcus, Group A Screen (Direct): POSITIVE — AB

## 2016-11-20 MED ORDER — FLUCONAZOLE 100 MG PO TABS
100.0000 mg | ORAL_TABLET | Freq: Once | ORAL | Status: AC
  Administered 2016-11-20: 100 mg via ORAL
  Filled 2016-11-20: qty 1

## 2016-11-20 MED ORDER — IBUPROFEN 800 MG PO TABS
800.0000 mg | ORAL_TABLET | Freq: Once | ORAL | Status: AC
  Administered 2016-11-20: 800 mg via ORAL
  Filled 2016-11-20: qty 1

## 2016-11-20 MED ORDER — IBUPROFEN 800 MG PO TABS: 800.0000 mg | ORAL_TABLET | Freq: Three times a day (TID) | ORAL | 0 refills | Status: AC | PRN

## 2016-11-20 MED ORDER — PENICILLIN G BENZATHINE 1200000 UNIT/2ML IM SUSP
1.2000 10*6.[IU] | Freq: Once | INTRAMUSCULAR | Status: AC
  Administered 2016-11-20: 1.2 10*6.[IU] via INTRAMUSCULAR
  Filled 2016-11-20: qty 2

## 2016-11-20 NOTE — ED Provider Notes (Signed)
[facility] EMERGENCY DEPARTMENT Provider Note   CSN: [id number] Arrival date & time: 11/20/16  1000     History   Chief Complaint Chief Complaint  Patient presents with  . Sore Throat    HPI Carolyn Barajas is a 26 y.o. female.  HPI Patient presents to the emergency department with sore throat over the last 2 days.  The patient states that the sore throat got worse and today she noted white spots on her tonsils.  The patient states she had strep throat in the past.  Patient states she did take some ibuprofen yesterday.  The patient states that nothing seems to make the condition better but swallowing makes the pain worse.  The patient denies chest pain, shortness of breath, headache,blurred vision, neck pain,cough, weakness, numbness, dizziness, anorexia, edema, abdominal pain, nausea, vomiting, diarrhea, rash, back pain, dysuria, hematemesis, bloody stool, near syncope, or syncope. History reviewed. No pertinent past medical history.  Patient Active Problem List   Diagnosis Date Noted  . Major depressive disorder, single episode, severe without psychotic features (HCC) 01/18/2016    History reviewed. No pertinent surgical history.  OB History    Gravida Para Term Preterm AB Living   1 0 0 0 0 0   SAB TAB Ectopic Multiple Live Births   0 0 0 0         Home Medications    Prior to Admission medications   Medication Sig Start Date End Date Taking? Authorizing Provider  acetaminophen (TYLENOL) 500 MG tablet Take 1 tablet (500 mg total) by mouth every 6 (six) hours as needed. For pain/fever. 01/22/16   Armandina StammerNwoko, Agnes I, NP  citalopram (CELEXA) 10 MG tablet Take 1 tablet (10 mg total) by mouth daily. For depression 01/23/16   Armandina StammerNwoko, Agnes I, NP  hydrOXYzine (ATARAX/VISTARIL) 25 MG tablet Take 1 tablet (25 mg) Q 6 hours as needed: For anxiety 01/22/16   Armandina StammerNwoko, Agnes I, NP  traZODone (DESYREL) 50 MG tablet Take 1 tablet (50 mg total) by mouth at bedtime as needed for sleep.  01/22/16   Sanjuana KavaNwoko, Agnes I, NP    Family History Family History  Problem Relation Age of Onset  . Breast cancer Mother   . Cancer Mother        lung    Social History Social History   Tobacco Use  . Smoking status: Current Every Day Smoker    Packs/day: 1.00  . Smokeless tobacco: Never Used  Substance Use Topics  . Alcohol use: Yes    Comment: weekly  . Drug use: No     Allergies   Patient has no known allergies.   Review of Systems Review of Systems All other systems negative except as documented in the HPI. All pertinent positives and negatives as reviewed in the HPI.  Physical Exam Updated Vital Signs BP (!) 141/78 (BP Location: Left Arm)   Pulse (!) 126   Temp (!) 100.5 F (38.1 C) (Oral)   Resp 20   Ht 5\' 2"  (1.575 m)   Wt 81.6 kg (180 lb)   LMP 11/20/2016   SpO2 100%   BMI 32.92 kg/m   Physical Exam  Constitutional: She is oriented to person, place, and time. She appears well-developed and well-nourished. No distress.  HENT:  Head: Normocephalic and atraumatic.  Mouth/Throat: Mucous membranes are normal. No uvula swelling. Posterior oropharyngeal edema and posterior oropharyngeal erythema present. No tonsillar abscesses. Tonsils are 2+ on the right. Tonsils are 2+ on the left.  Tonsillar exudate.  Eyes: Pupils are equal, round, and reactive to light.  Neck: Normal range of motion. Neck supple. No thyromegaly present.  Cardiovascular: Normal rate, regular rhythm and normal heart sounds. Exam reveals no gallop and no friction rub.  No murmur heard. Pulmonary/Chest: Effort normal and breath sounds normal. No respiratory distress. She has no wheezes.  Abdominal: Soft. Bowel sounds are normal. She exhibits no distension. There is no tenderness.  Lymphadenopathy:    She has cervical adenopathy.  Neurological: She is alert and oriented to person, place, and time. She exhibits normal muscle tone. Coordination normal.  Skin: Skin is warm and dry. Capillary  refill takes less [place] 2 seconds. No rash noted. No erythema.  Psychiatric: She has a normal mood and affect. Her behavior is normal.  Nursing note and vitals reviewed.    ED Treatments / Results  Labs (all labs ordered are listed, but only abnormal results are displayed) Labs Reviewed  RAPID STREP SCREEN (NOT AT [facility]) - Abnormal; Notable for the following components:      Result Value   Streptococcus, Group A Screen (Direct) POSITIVE (*)    All other components within normal limits    EKG  EKG Interpretation None       Radiology No results found.  Procedures Procedures (including critical care time)  Medications Ordered in ED Medications  penicillin g benzathine (BICILLIN LA) 1200000 UNIT/2ML injection 1.2 Million Units (not administered)  fluconazole (DIFLUCAN) tablet 100 mg (not administered)     Initial Impression / Assessment and Plan / ED Course  I have reviewed the triage vital signs and the nursing notes.  Pertinent labs & imaging results that were available during my care of the patient were reviewed by me and considered in my medical decision making (see chart for details).    Patient has strep pharyngitis we will treat with Bicillin LA.  Patient is advised to take Tylenol and Motrin for fever and pain.  Told to increase her fluid intake and rest as much as possible.  Patient agrees to the plan and all questions were answered  Final Clinical Impressions(s) / ED Diagnoses   Final diagnoses:  None    ED Discharge Orders    None       Charlestine NightLawyer, Drayke Grabel, PA-C 11/20/16 1038    Jacalyn LefevreHaviland, Julie, MD 11/20/16 [phone]

## 2016-11-20 NOTE — Discharge Instructions (Signed)
Take Tylenol in addition to the Motrin.  Increase her fluid intake.  Rest as much as possible.  Return here as needed.

## 2016-11-20 NOTE — ED Triage Notes (Signed)
Pt c/o sore throat since Wed.  ?

## 2017-02-13 ENCOUNTER — Encounter (HOSPITAL_BASED_OUTPATIENT_CLINIC_OR_DEPARTMENT_OTHER): Payer: Self-pay | Admitting: Emergency Medicine

## 2017-02-13 ENCOUNTER — Other Ambulatory Visit: Payer: Self-pay

## 2017-02-13 ENCOUNTER — Emergency Department (HOSPITAL_BASED_OUTPATIENT_CLINIC_OR_DEPARTMENT_OTHER)
Admission: EM | Admit: 2017-02-13 | Discharge: 2017-02-13 | Disposition: A | Discharge status: Home / Self Care | Payer: Self-pay | Attending: Emergency Medicine | Admitting: Emergency Medicine

## 2017-02-13 DIAGNOSIS — F17210 Nicotine dependence, cigarettes, uncomplicated: Secondary | ICD-10-CM | POA: Insufficient documentation

## 2017-02-13 DIAGNOSIS — Z79899 Other long term (current) drug therapy: Secondary | ICD-10-CM | POA: Insufficient documentation

## 2017-02-13 DIAGNOSIS — J069 Acute upper respiratory infection, unspecified: Secondary | ICD-10-CM | POA: Insufficient documentation

## 2017-02-13 DIAGNOSIS — B9789 Other viral agents as the cause of diseases classified elsewhere: Secondary | ICD-10-CM | POA: Insufficient documentation

## 2017-02-13 LAB — RAPID STREP SCREEN (MED CTR MEBANE ONLY): Streptococcus, Group A Screen (Direct): NEGATIVE

## 2017-02-13 MED ORDER — ACETAMINOPHEN 325 MG PO TABS
650.0000 mg | ORAL_TABLET | Freq: Once | ORAL | Status: AC | PRN
  Administered 2017-02-13: 650 mg via ORAL
  Filled 2017-02-13: qty 2

## 2017-02-13 NOTE — ED Notes (Signed)
Pt states she has had sore throat, H/A, chills, cough since yesterday. Productive at times with yellow sputum-"sometimes with a little blood".

## 2017-02-13 NOTE — ED Provider Notes (Signed)
[facility] EMERGENCY DEPARTMENT Provider Note   CSN: [id number] Arrival date & time: 02/13/17  1718     History   Chief Complaint Chief Complaint  Patient presents with  . Sore Throat  . Chills    HPI Synetta Failnita Ludvigsen is a 27 y.o. female presenting to the ED with acute onset of sore throat that began yesterday.  Patient states she also has a dry cough, which has been making her throat hurt more.  Has not tried any medications at home for symptoms.  Denies associated congestion, rhinorrhea, ear pain, or other complaints.  Not had influenza vaccine this year.  No sick contacts.  The history is provided by the patient.    History reviewed. No pertinent past medical history.  Patient Active Problem List   Diagnosis Date Noted  . Major depressive disorder, single episode, severe without psychotic features (HCC) 01/18/2016    History reviewed. No pertinent surgical history.  OB History    Gravida Para Term Preterm AB Living   1 0 0 0 0 0   SAB TAB Ectopic Multiple Live Births   0 0 0 0         Home Medications    Prior to Admission medications   Medication Sig Start Date End Date Taking? Authorizing Provider  acetaminophen (TYLENOL) 500 MG tablet Take 1 tablet (500 mg total) by mouth every 6 (six) hours as needed. For pain/fever. 01/22/16   Armandina StammerNwoko, Agnes I, NP  citalopram (CELEXA) 10 MG tablet Take 1 tablet (10 mg total) by mouth daily. For depression 01/23/16   Armandina StammerNwoko, Agnes I, NP  hydrOXYzine (ATARAX/VISTARIL) 25 MG tablet Take 1 tablet (25 mg) Q 6 hours as needed: For anxiety 01/22/16   Armandina StammerNwoko, Agnes I, NP  ibuprofen (ADVIL,MOTRIN) 800 MG tablet Take 1 tablet (800 mg total) every 8 (eight) hours as needed by mouth. 11/20/16   Lawyer, Cristal Deerhristopher, PA-C  traZODone (DESYREL) 50 MG tablet Take 1 tablet (50 mg total) by mouth at bedtime as needed for sleep. 01/22/16   Sanjuana KavaNwoko, Agnes I, NP    Family History Family History  Problem Relation Age of Onset  . Breast cancer  Mother   . Cancer Mother        lung    Social History Social History   Tobacco Use  . Smoking status: Current Every Day Smoker    Packs/day: 1.00    Types: Cigarettes  . Smokeless tobacco: Never Used  Substance Use Topics  . Alcohol use: Yes    Comment: weekly  . Drug use: No     Allergies   Patient has no known allergies.   Review of Systems Review of Systems  Constitutional: Negative for fever.  HENT: Positive for sore throat. Negative for congestion, ear pain, rhinorrhea, trouble swallowing and voice change.   Respiratory: Positive for cough.   All other systems reviewed and are negative.    Physical Exam Updated Vital Signs BP (!) 142/79 (BP Location: Right Arm)   Pulse (!) 106   Temp 99.6 F (37.6 C) (Oral)   Resp 20   Ht 5\' 2"  (1.575 m)   Wt 108.9 kg (240 lb)   LMP 01/13/2017   SpO2 98%   Breastfeeding? No   BMI 43.90 kg/m   Physical Exam  Constitutional: She appears well-developed and well-nourished. She does not appear ill. No distress.  HENT:  Head: Normocephalic and atraumatic.  Right Ear: Tympanic membrane and ear canal normal.  Left Ear: Tympanic membrane  and ear canal normal.  Mouth/Throat: Uvula is midline. No uvula swelling.  Pharynx with erythema, no edema or exudates.  Tolerating secretions.  Eyes: Conjunctivae are normal.  Neck: Normal range of motion. Neck supple.  Cardiovascular: Regular rhythm, normal heart sounds and intact distal pulses.  mildly tachycardic  Pulmonary/Chest: Effort normal and breath sounds normal. No respiratory distress.  Lymphadenopathy:    She has no cervical adenopathy.  Psychiatric: She has a normal mood and affect. Her behavior is normal.  Nursing note and vitals reviewed.    ED Treatments / Results  Labs (all labs ordered are listed, but only abnormal results are displayed) Labs Reviewed  RAPID STREP SCREEN (NOT AT [facility])  CULTURE, GROUP A STREP [facility])    EKG  EKG Interpretation None        Radiology No results found.  Procedures Procedures (including critical care time)  Medications Ordered in ED Medications  acetaminophen (TYLENOL) tablet 650 mg (650 mg Oral Given 02/13/17 2009)     Initial Impression / Assessment and Plan / ED Course  I have reviewed the triage vital signs and the nursing notes.  Pertinent labs & imaging results that were available during my care of the patient were reviewed by me and considered in my medical decision making (see chart for details).    Patients symptoms are consistent with URI, likely viral etiology. Strep neg. Low grade fever, improved with tylenol. Discussed that antibiotics are not indicated for viral infections. Pt will be discharged with symptomatic treatment. Verbalizes understanding and is agreeable with plan. Pt is hemodynamically stable & in NAD prior to dc.  Discussed results, findings, treatment and follow up. Patient advised of return precautions. Patient verbalized understanding and agreed with plan.  Final Clinical Impressions(s) / ED Diagnoses   Final diagnoses:  Viral URI with cough    ED Discharge Orders    None       Robinson, [place] N, PA-C 02/13/17 2140    Tilden Fossa, MD 02/14/17 [phone]

## 2017-02-13 NOTE — Discharge Instructions (Signed)
Please read instructions below. You can take tylenol and ibuprofen as needed for sore throat or body aches. Drink plenty of water. Follow up with your primary care provider as needed. Return to the ER for difficulty swallowing liquids, difficulty breathing, or new or worsening symptoms.

## 2017-02-13 NOTE — ED Notes (Signed)
Pt given d/c instructions as per chart. Verbalizes understanding. No questions. 

## 2017-02-13 NOTE — ED Triage Notes (Signed)
Pt c/o cough, sore throat and chills onset last night. Pt has not been able to check her temperature at home. Pt has not tried any OTC medications.

## 2017-02-16 LAB — CULTURE, GROUP A STREP (THRC)

## 2017-03-28 ENCOUNTER — Other Ambulatory Visit: Payer: Self-pay

## 2017-03-28 ENCOUNTER — Emergency Department (HOSPITAL_BASED_OUTPATIENT_CLINIC_OR_DEPARTMENT_OTHER)
Admission: EM | Admit: 2017-03-28 | Discharge: 2017-03-28 | Disposition: A | Discharge status: Home / Self Care | Payer: Self-pay | Attending: Emergency Medicine | Admitting: Emergency Medicine

## 2017-03-28 ENCOUNTER — Encounter (HOSPITAL_BASED_OUTPATIENT_CLINIC_OR_DEPARTMENT_OTHER): Payer: Self-pay | Admitting: *Deleted

## 2017-03-28 DIAGNOSIS — F17210 Nicotine dependence, cigarettes, uncomplicated: Secondary | ICD-10-CM | POA: Insufficient documentation

## 2017-03-28 DIAGNOSIS — J069 Acute upper respiratory infection, unspecified: Secondary | ICD-10-CM | POA: Insufficient documentation

## 2017-03-28 DIAGNOSIS — B9789 Other viral agents as the cause of diseases classified elsewhere: Secondary | ICD-10-CM | POA: Insufficient documentation

## 2017-03-28 DIAGNOSIS — Z79899 Other long term (current) drug therapy: Secondary | ICD-10-CM | POA: Insufficient documentation

## 2017-03-28 LAB — PREGNANCY, URINE: Preg Test, Ur: NEGATIVE

## 2017-03-28 LAB — RAPID STREP SCREEN (MED CTR MEBANE ONLY): STREPTOCOCCUS, GROUP A SCREEN (DIRECT): NEGATIVE

## 2017-03-28 MED ORDER — ACETAMINOPHEN 325 MG PO TABS
650.0000 mg | ORAL_TABLET | Freq: Once | ORAL | Status: AC | PRN
  Administered 2017-03-28: 650 mg via ORAL
  Filled 2017-03-28: qty 2

## 2017-03-28 NOTE — ED Triage Notes (Signed)
Sore throat and ear pain x 3 days.  

## 2017-03-28 NOTE — ED Provider Notes (Signed)
[facility] EMERGENCY DEPARTMENT Provider Note   CSN: [id number] Arrival date & time: 03/28/17  1409     History   Chief Complaint Chief Complaint  Patient presents with  . Sore Throat  . Otalgia    HPI Carolyn Barajas is a 27 y.o. female.  Chief complaint is sore throat, right ear pain  HPI 27 year old female.  She states she "gets sick every year".  Sore throat and right ear pain for the last 2 days.  Has not taken any pain medication over-the-counter or any other attempted self treatment.  [place].  No cough.  No eye drainage.  No GI complaints.  History reviewed. No pertinent past medical history.  Patient Active Problem List   Diagnosis Date Noted  . Major depressive disorder, single episode, severe without psychotic features (HCC) 01/18/2016    History reviewed. No pertinent surgical history.   OB History    Gravida  1   Para  0   Term  0   Preterm  0   AB  0   Living  0     SAB  0   TAB  0   Ectopic  0   Multiple  0   Live Births               Home Medications    Prior to Admission medications   Medication Sig Start Date End Date Taking? Authorizing Provider  acetaminophen (TYLENOL) 500 MG tablet Take 1 tablet (500 mg total) by mouth every 6 (six) hours as needed. For pain/fever. 01/22/16   Armandina Stammer I, NP  citalopram (CELEXA) 10 MG tablet Take 1 tablet (10 mg total) by mouth daily. For depression 01/23/16   Armandina Stammer I, NP  hydrOXYzine (ATARAX/VISTARIL) 25 MG tablet Take 1 tablet (25 mg) Q 6 hours as needed: For anxiety 01/22/16   Armandina Stammer I, NP  ibuprofen (ADVIL,MOTRIN) 800 MG tablet Take 1 tablet (800 mg total) every 8 (eight) hours as needed by mouth. 11/20/16   Lawyer, Cristal Deer, PA-C  traZODone (DESYREL) 50 MG tablet Take 1 tablet (50 mg total) by mouth at bedtime as needed for sleep. 01/22/16   Sanjuana Kava, NP    Family History Family History  Problem Relation Age of Onset  . Breast cancer Mother   . Cancer  Mother        lung    Social History Social History   Tobacco Use  . Smoking status: Current Every Day Smoker    Packs/day: 1.00    Types: Cigarettes  . Smokeless tobacco: Never Used  Substance Use Topics  . Alcohol use: Yes    Comment: weekly  . Drug use: No     Allergies   Patient has no known allergies.   Review of Systems Review of Systems  Constitutional: Negative for appetite change, chills, diaphoresis, fatigue and fever.  HENT: Positive for ear pain and sore throat. Negative for mouth sores and trouble swallowing.   Eyes: Negative for visual disturbance.  Respiratory: Negative for cough, chest tightness, shortness of breath and wheezing.   Cardiovascular: Negative for chest pain.  Gastrointestinal: Negative for abdominal distention, abdominal pain, diarrhea, nausea and vomiting.  Endocrine: Negative for polydipsia, polyphagia and polyuria.  Genitourinary: Negative for dysuria, frequency and hematuria.  Musculoskeletal: Negative for gait problem.  Skin: Negative for color change, pallor and rash.  Neurological: Negative for dizziness, syncope, light-headedness and headaches.  Hematological: Does not bruise/bleed easily.  Psychiatric/Behavioral: Negative for behavioral problems  and confusion.     Physical Exam Updated Vital Signs BP 131/75 (BP Location: Left Arm)   Pulse (!) 104   Temp 98.7 F (37.1 C) (Oral)   Resp 16   Ht 5\' 2"  (1.575 m)   Wt 108.9 kg (240 lb)   LMP 03/01/2017   SpO2 97%   BMI 43.90 kg/m   Physical Exam  Constitutional: She is oriented to person, place, and time. She appears well-developed and well-nourished. No distress.  HENT:  Head: Normocephalic.  Plus tonsillar hypertrophy.  White exudate.  No petechiae on the palate.  She does have right anterior cervical adenopathy.  TMs appear normal.  No posterior cervical nodes  Eyes: Pupils are equal, round, and reactive to light. Conjunctivae are normal. No scleral icterus.  Neck:  Normal range of motion. Neck supple. No thyromegaly present.  Cardiovascular: Normal rate and regular rhythm. Exam reveals no gallop and no friction rub.  No murmur heard. Pulmonary/Chest: Effort normal and breath sounds normal. No respiratory distress. She has no wheezes. She has no rales.  Abdominal: Soft. Bowel sounds are normal. She exhibits no distension. There is no tenderness. There is no rebound.  Musculoskeletal: Normal range of motion.  Neurological: She is alert and oriented to person, place, and time.  Skin: Skin is warm and dry. No rash noted.  Psychiatric: She has a normal mood and affect. Her behavior is normal.     ED Treatments / Results  Labs (all labs ordered are listed, but only abnormal results are displayed) Labs Reviewed  RAPID STREP SCREEN (NOT AT [facility])  CULTURE, GROUP A STREP [facility]  PREGNANCY, URINE    EKG None  Radiology No results found.  Procedures Procedures (including critical care time)  Medications Ordered in ED Medications  acetaminophen (TYLENOL) tablet 650 mg (650 mg Oral Given 03/28/17 1437)     Initial Impression / Assessment and Plan / ED Course  I have reviewed the triage vital signs and the nursing notes.  Pertinent labs & imaging results that were available during my care of the patient were reviewed by me and considered in my medical decision making (see chart for details).    Swab.  Plan will be expectant management for viral upper respiratory infection-uncomplicated.  Final Clinical Impressions(s) / ED Diagnoses   Final diagnoses:  Viral upper respiratory tract infection    ED Discharge Orders    None       Rolland PorterJames, Yehoshua Vitelli, MD 03/28/17 [id number]

## 2017-03-28 NOTE — Discharge Instructions (Addendum)
No specific treatment required for cold virus infections.  Motrin or Tylenol for sore throat.  Testing here was negative.

## 2017-03-31 LAB — CULTURE, GROUP A STREP (THRC)

## 2017-04-02 ENCOUNTER — Encounter (HOSPITAL_BASED_OUTPATIENT_CLINIC_OR_DEPARTMENT_OTHER): Payer: Self-pay | Admitting: Emergency Medicine

## 2017-04-02 ENCOUNTER — Other Ambulatory Visit: Payer: Self-pay

## 2017-04-02 ENCOUNTER — Emergency Department (HOSPITAL_BASED_OUTPATIENT_CLINIC_OR_DEPARTMENT_OTHER)
Admission: EM | Admit: 2017-04-02 | Discharge: 2017-04-02 | Disposition: A | Discharge status: Home / Self Care | Payer: Self-pay | Attending: Emergency Medicine | Admitting: Emergency Medicine

## 2017-04-02 DIAGNOSIS — J029 Acute pharyngitis, unspecified: Secondary | ICD-10-CM | POA: Insufficient documentation

## 2017-04-02 DIAGNOSIS — F17210 Nicotine dependence, cigarettes, uncomplicated: Secondary | ICD-10-CM | POA: Insufficient documentation

## 2017-04-02 MED ORDER — PROMETHAZINE-DM 6.25-15 MG/5ML PO SYRP
5.0000 mL | ORAL_SOLUTION | Freq: Four times a day (QID) | ORAL | 0 refills | Status: AC | PRN
Start: 2017-04-02 — End: ?

## 2017-04-02 MED ORDER — PHENOL 1.4 % MT LIQD: 1.0000 | OROMUCOSAL | 0 refills | Status: AC | PRN

## 2017-04-02 MED ORDER — DEXAMETHASONE SODIUM PHOSPHATE 10 MG/ML IJ SOLN
10.0000 mg | Freq: Once | INTRAMUSCULAR | Status: AC
  Administered 2017-04-02: 10 mg via INTRAMUSCULAR
  Filled 2017-04-02: qty 1

## 2017-04-02 NOTE — ED Triage Notes (Signed)
Sore throat for over a week. Seen here Monday with negative strep.

## 2017-04-02 NOTE — Discharge Instructions (Signed)
You likely have a viral illness.  This should be treated symptomatically. Use Tylenol or ibuprofen as needed for fevers or body aches. Take cough syrup as needed.  Use sore throat spray as needed. Make sure you stay well-hydrated with water. Wash your hands frequently to prevent spread of infection. Follow-up with your primary care doctor in 1 week if your symptoms are not improving. Return to the emergency room if you develop chest pain, difficulty breathing, or any new or worsening symptoms.

## 2017-04-02 NOTE — ED Provider Notes (Signed)
[facility] EMERGENCY DEPARTMENT Provider Note   CSN: [id number] Arrival date & time: 04/02/17  1620     History   Chief Complaint Chief Complaint  Patient presents with  . Sore Throat    HPI Carolyn Barajas is a 27 y.o. female presenting for evaluation of sore throat.  Patient states for the past week, she has had a sore throat.  She was evaluated on Monday, had a negative strep test.  Since then, all her symptoms have resolved except a sore throat.  She reports he still continues to bother her, especially when swallowing.  She reports she has associated cough which is productive.  Worse at night.  She denies fevers, chills, eye pain, ear pain, chest pain, shortness of breath, nausea, vomiting, abdominal pain.  She smokes cigarettes.  Has no other medical problems, does not take medications daily.  HPI  History reviewed. No pertinent past medical history.  Patient Active Problem List   Diagnosis Date Noted  . Major depressive disorder, single episode, severe without psychotic features (HCC) 01/18/2016    History reviewed. No pertinent surgical history.   OB History    Gravida  1   Para  0   Term  0   Preterm  0   AB  0   Living  0     SAB  0   TAB  0   Ectopic  0   Multiple  0   Live Births               Home Medications    Prior to Admission medications   Medication Sig Start Date End Date Taking? Authorizing Provider  acetaminophen (TYLENOL) 500 MG tablet Take 1 tablet (500 mg total) by mouth every 6 (six) hours as needed. For pain/fever. 01/22/16   Armandina StammerNwoko, Agnes I, NP  citalopram (CELEXA) 10 MG tablet Take 1 tablet (10 mg total) by mouth daily. For depression 01/23/16   Armandina StammerNwoko, Agnes I, NP  hydrOXYzine (ATARAX/VISTARIL) 25 MG tablet Take 1 tablet (25 mg) Q 6 hours as needed: For anxiety 01/22/16   Armandina StammerNwoko, Agnes I, NP  ibuprofen (ADVIL,MOTRIN) 800 MG tablet Take 1 tablet (800 mg total) every 8 (eight) hours as needed by mouth. 11/20/16   Lawyer,  Cristal Deerhristopher, PA-C  phenol (CHLORASEPTIC) 1.4 % LIQD Use as directed 1 spray in the mouth or throat as needed for throat irritation / pain. 04/02/17   Bhavya Eschete, PA-C  promethazine-dextromethorphan (PROMETHAZINE-DM) 6.25-15 MG/5ML syrup Take 5 mLs by mouth 4 (four) times daily as needed for cough. 04/02/17   Geordie Nooney, PA-C  traZODone (DESYREL) 50 MG tablet Take 1 tablet (50 mg total) by mouth at bedtime as needed for sleep. 01/22/16   Sanjuana KavaNwoko, Agnes I, NP    Family History Family History  Problem Relation Age of Onset  . Breast cancer Mother   . Cancer Mother        lung    Social History Social History   Tobacco Use  . Smoking status: Current Every Day Smoker    Packs/day: 1.00    Types: Cigarettes  . Smokeless tobacco: Never Used  Substance Use Topics  . Alcohol use: Yes    Comment: weekly  . Drug use: No     Allergies   Patient has no known allergies.   Review of Systems Review of Systems  Constitutional: Negative for chills and fever.  HENT: Positive for sore throat. Negative for congestion.   Respiratory: Positive for cough. Negative  for chest tightness and shortness of breath.   Cardiovascular: Negative for chest pain.     Physical Exam Updated Vital Signs BP 117/69 (BP Location: Left Arm)   Pulse 99   Temp 99.5 F (37.5 C) (Oral)   Resp 18   Ht 5\' 2"  (1.575 m)   Wt 108.9 kg (240 lb)   LMP 03/29/2017   SpO2 98%   BMI 43.90 kg/m   Physical Exam  Constitutional: She is oriented to person, place, and time. She appears well-developed and well-nourished. No distress.  HENT:  Head: Normocephalic and atraumatic.  Right Ear: Tympanic membrane, external ear and ear canal normal.  Left Ear: Tympanic membrane, external ear and ear canal normal.  Nose: Mucosal edema present. Right sinus exhibits no maxillary sinus tenderness and no frontal sinus tenderness. Left sinus exhibits no maxillary sinus tenderness and no frontal sinus tenderness.    Mouth/Throat: Uvula is midline and mucous membranes are normal. Posterior oropharyngeal erythema present. Tonsils are 2+ on the right. Tonsils are 1+ on the left. No tonsillar exudate.  Nasal mucosal edema. Mild OP erythema and bilateral tonsillar swelling, R>L. No exudate. Uvula midline with equal palate rise. TMs nonerythematous and non bulging bilaterally.  Eyes: Pupils are equal, round, and reactive to light. Conjunctivae and EOM are normal.  Neck: Normal range of motion.  Cardiovascular: Normal rate, regular rhythm and intact distal pulses.  Pulmonary/Chest: Effort normal and breath sounds normal. She has no decreased breath sounds. She has no wheezes. She has no rhonchi. She has no rales.  Pt speaking in full sentences without difficulty. Clear lung sounds in all fields  Abdominal: Soft. She exhibits no distension. There is no tenderness.  Musculoskeletal: Normal range of motion.  Lymphadenopathy:    She has no cervical adenopathy.  Neurological: She is alert and oriented to person, place, and time.  Skin: Skin is warm.  Psychiatric: She has a normal mood and affect.  Nursing note and vitals reviewed.    ED Treatments / Results  Labs (all labs ordered are listed, but only abnormal results are displayed) Labs Reviewed - No data to display  EKG None  Radiology No results found.  Procedures Procedures (including critical care time)  Medications Ordered in ED Medications  dexamethasone (DECADRON) injection 10 mg (10 mg Intramuscular Given 04/02/17 1856)     Initial Impression / Assessment and Plan / ED Course  I have reviewed the triage vital signs and the nursing notes.  Pertinent labs & imaging results that were available during my care of the patient were reviewed by me and considered in my medical decision making (see chart for details).     Patient presenting with 7 days sore throat and cough. Physical exam reassuring, patient is afebrile and appears nontoxic.   Pulmonary exam reassuring.  Doubt pneumonia, strep, other bacterial infection, or peritonsillar abscess. Strep test negative on Monday, exam not c/w with strep today.  Likely viral URI.  Will give decadron shot today for sx control. Will treat symptomatically.  Patient to follow-up with ent as needed.  At this time, patient appears safe for discharge.  Return precautions given.  Patient states she understands and agrees to plan.   Final Clinical Impressions(s) / ED Diagnoses   Final diagnoses:  Pharyngitis, unspecified etiology    ED Discharge Orders        Ordered    promethazine-dextromethorphan (PROMETHAZINE-DM) 6.25-15 MG/5ML syrup  4 times daily PRN     04/02/17 1853    phenol (  CHLORASEPTIC) 1.4 % LIQD  As needed     04/02/17 1853       Alveria Apley, PA-C 04/02/17 1918    Rolland Porter, MD 04/05/17 0003

## 2017-04-05 ENCOUNTER — Encounter (HOSPITAL_BASED_OUTPATIENT_CLINIC_OR_DEPARTMENT_OTHER): Payer: Self-pay

## 2017-04-05 ENCOUNTER — Emergency Department (HOSPITAL_COMMUNITY): Admission: EM | Admit: 2017-04-05 | Discharge: 2017-04-05 | Discharge status: Against Medical Advice | Payer: Self-pay

## 2017-04-05 ENCOUNTER — Other Ambulatory Visit: Payer: Self-pay

## 2017-04-05 ENCOUNTER — Emergency Department (HOSPITAL_BASED_OUTPATIENT_CLINIC_OR_DEPARTMENT_OTHER)
Admission: EM | Admit: 2017-04-05 | Discharge: 2017-04-05 | Disposition: A | Discharge status: Home / Self Care | Payer: Self-pay | Attending: Emergency Medicine | Admitting: Emergency Medicine

## 2017-04-05 DIAGNOSIS — J029 Acute pharyngitis, unspecified: Secondary | ICD-10-CM | POA: Insufficient documentation

## 2017-04-05 MED ORDER — PREDNISONE 10 MG PO TABS: 20.0000 mg | ORAL_TABLET | Freq: Two times a day (BID) | ORAL | 0 refills | Status: AC

## 2017-04-05 MED ORDER — CEPHALEXIN 500 MG PO CAPS: 500.0000 mg | ORAL_CAPSULE | Freq: Four times a day (QID) | ORAL | 0 refills | Status: AC

## 2017-04-05 NOTE — ED Provider Notes (Signed)
[facility] EMERGENCY DEPARTMENT Provider Note   CSN: [id number] Arrival date & time: 04/05/17  0210     History   Chief Complaint Chief Complaint  Patient presents with  . Sore Throat    HPI Carolyn Barajas is a 27 y.o. female.  Patient is a 27 year old female who presents with complaints of sore throat.  This is been ongoing for 1 week.  She denies any fevers or chills.  She has had negative strep tests and told her symptoms were most likely viral.  She states that she has difficulty swallowing and speaking due to the pain in her tongue and throat.  The history is provided by the patient.  Sore Throat  This is a new problem. Episode onset: 1 week ago. The problem occurs constantly. The problem has been gradually worsening. Nothing aggravates the symptoms. Nothing relieves the symptoms. Treatments tried: NSAIDs. The treatment provided no relief.    History reviewed. No pertinent past medical history.  Patient Active Problem List   Diagnosis Date Noted  . Major depressive disorder, single episode, severe without psychotic features (HCC) 01/18/2016    History reviewed. No pertinent surgical history.   OB History    Gravida  1   Para  0   Term  0   Preterm  0   AB  0   Living  0     SAB  0   TAB  0   Ectopic  0   Multiple  0   Live Births               Home Medications    Prior to Admission medications   Medication Sig Start Date End Date Taking? Authorizing Provider  acetaminophen (TYLENOL) 500 MG tablet Take 1 tablet (500 mg total) by mouth every 6 (six) hours as needed. For pain/fever. 01/22/16   Armandina StammerNwoko, Agnes I, NP  cephALEXin (KEFLEX) 500 MG capsule Take 1 capsule (500 mg total) by mouth 4 (four) times daily. 04/05/17   Geoffery Lyonselo, Johnetta Sloniker, MD  citalopram (CELEXA) 10 MG tablet Take 1 tablet (10 mg total) by mouth daily. For depression 01/23/16   Armandina StammerNwoko, Agnes I, NP  hydrOXYzine (ATARAX/VISTARIL) 25 MG tablet Take 1 tablet (25 mg) Q 6 hours as needed:  For anxiety 01/22/16   Armandina StammerNwoko, Agnes I, NP  ibuprofen (ADVIL,MOTRIN) 800 MG tablet Take 1 tablet (800 mg total) every 8 (eight) hours as needed by mouth. 11/20/16   Lawyer, Cristal Deerhristopher, PA-C  phenol (CHLORASEPTIC) 1.4 % LIQD Use as directed 1 spray in the mouth or throat as needed for throat irritation / pain. 04/02/17   Caccavale, Sophia, PA-C  predniSONE (DELTASONE) 10 MG tablet Take 2 tablets (20 mg total) by mouth 2 (two) times daily. 04/05/17   Geoffery Lyonselo, Neylan Koroma, MD  promethazine-dextromethorphan (PROMETHAZINE-DM) 6.25-15 MG/5ML syrup Take 5 mLs by mouth 4 (four) times daily as needed for cough. 04/02/17   Caccavale, Sophia, PA-C  traZODone (DESYREL) 50 MG tablet Take 1 tablet (50 mg total) by mouth at bedtime as needed for sleep. 01/22/16   Sanjuana KavaNwoko, Agnes I, NP    Family History Family History  Problem Relation Age of Onset  . Breast cancer Mother   . Cancer Mother        lung    Social History Social History   Tobacco Use  . Smoking status: Current Every Day Smoker    Packs/day: 1.00    Types: Cigarettes  . Smokeless tobacco: Never Used  Substance Use Topics  .  Alcohol use: Yes    Comment: weekly  . Drug use: No     Allergies   Patient has no known allergies.   Review of Systems Review of Systems  All other systems reviewed and are negative.    Physical Exam Updated Vital Signs BP (!) 156/109 (BP Location: Left Arm)   Pulse (!) 108   Temp 98.9 F (37.2 C) (Oral)   Resp 18   Ht 5\' 2"  (1.575 m)   Wt 113.4 kg (250 lb)   LMP 04/05/2017   SpO2 100%   BMI 45.73 kg/m   Physical Exam  Constitutional: She is oriented to person, place, and time. She appears well-developed and well-nourished. No distress.  HENT:  Head: Normocephalic and atraumatic.  Right Ear: Hearing and tympanic membrane normal. No drainage.  Left Ear: Hearing and tympanic membrane normal. No drainage.  Mouth/Throat: Uvula is midline, oropharynx is clear and moist and mucous membranes are normal. No oral  lesions. No uvula swelling. No oropharyngeal exudate, posterior oropharyngeal edema, posterior oropharyngeal erythema or tonsillar abscesses. Tonsils are 0 on the right. Tonsils are 0 on the left. No tonsillar exudate.  Neck: Normal range of motion. Neck supple.  Cardiovascular: Normal rate and regular rhythm. Exam reveals no gallop and no friction rub.  No murmur heard. Pulmonary/Chest: Effort normal and breath sounds normal. No respiratory distress. She has no wheezes.  Abdominal: Soft. Bowel sounds are normal. She exhibits no distension. There is no tenderness.  Musculoskeletal: Normal range of motion.  Neurological: She is alert and oriented to person, place, and time.  Skin: Skin is warm and dry. She is not diaphoretic.  Nursing note and vitals reviewed.    ED Treatments / Results  Labs (all labs ordered are listed, but only abnormal results are displayed) Labs Reviewed - No data to display  EKG None  Radiology No results found.  Procedures Procedures (including critical care time)  Medications Ordered in ED Medications - No data to display   Initial Impression / Assessment and Plan / ED Course  I have reviewed the triage vital signs and the nursing notes.  Pertinent labs & imaging results that were available during my care of the patient were reviewed by me and considered in my medical decision making (see chart for details).  I see nothing on exam that is of concern.  There is no stridor, no tonsillar hypertrophy, no exudates, or other visible abnormality.  I am uncertain as to what is causing her discomfort, however as this is her fourth visit to the ER this week, she will be given prednisone, Keflex for presumed tonsillitis, and follow-up with ENT if not improving.  Final Clinical Impressions(s) / ED Diagnoses   Final diagnoses:  Pharyngitis, unspecified etiology    ED Discharge Orders        Ordered    predniSONE (DELTASONE) 10 MG tablet  2 times daily      04/05/17 0239    cephALEXin (KEFLEX) 500 MG capsule  4 times daily     04/05/17 0239       Geoffery Lyons, MD 04/05/17 [phone]

## 2017-04-05 NOTE — ED Notes (Signed)
Pt verbalizes understanding of dc instructions and denies any further needs at this time.  Pt strongly encouraged to stop smoking and make an appointment with ENT for follow up as she has been advised previously.

## 2017-04-05 NOTE — ED Triage Notes (Signed)
3rd visit for same, states prescriptions aren't helping and now she feels like her tongue is swollen and painful since Friday

## 2017-04-05 NOTE — Discharge Instructions (Addendum)
Keflex and prednisone as prescribed.  Follow-up with ear nose and throat if not improving in the next 3-4 days as previously recommended.

## 2017-08-25 ENCOUNTER — Encounter (HOSPITAL_BASED_OUTPATIENT_CLINIC_OR_DEPARTMENT_OTHER): Payer: Self-pay | Admitting: Emergency Medicine

## 2017-08-25 ENCOUNTER — Emergency Department (HOSPITAL_BASED_OUTPATIENT_CLINIC_OR_DEPARTMENT_OTHER)
Admission: EM | Admit: 2017-08-25 | Discharge: 2017-08-25 | Disposition: A | Discharge status: Home / Self Care | Payer: Self-pay | Attending: Emergency Medicine | Admitting: Emergency Medicine

## 2017-08-25 ENCOUNTER — Other Ambulatory Visit: Payer: Self-pay

## 2017-08-25 DIAGNOSIS — Z79899 Other long term (current) drug therapy: Secondary | ICD-10-CM | POA: Insufficient documentation

## 2017-08-25 DIAGNOSIS — F329 Major depressive disorder, single episode, unspecified: Secondary | ICD-10-CM | POA: Insufficient documentation

## 2017-08-25 DIAGNOSIS — F17210 Nicotine dependence, cigarettes, uncomplicated: Secondary | ICD-10-CM | POA: Insufficient documentation

## 2017-08-25 DIAGNOSIS — J069 Acute upper respiratory infection, unspecified: Secondary | ICD-10-CM | POA: Insufficient documentation

## 2017-08-25 LAB — GROUP A STREP BY PCR: GROUP A STREP BY PCR: NOT DETECTED

## 2017-08-25 MED ORDER — IBUPROFEN 800 MG PO TABS
800.0000 mg | ORAL_TABLET | Freq: Once | ORAL | Status: AC
  Administered 2017-08-25: 800 mg via ORAL
  Filled 2017-08-25: qty 1

## 2017-08-25 NOTE — ED Triage Notes (Signed)
Reports sore throat with "spots on it" since she woke up this morning.

## 2017-08-25 NOTE — ED Provider Notes (Signed)
[facility] EMERGENCY DEPARTMENT Provider Note   CSN: [id number] Arrival date & time: 08/25/17  1147     History   Chief Complaint Chief Complaint  Patient presents with  . Sore Throat    HPI Carolyn Barajas is a 27 y.o. female.  Patient is a 27 year old female who presents with a sore throat.  She has had a 2-day history of myalgias and just not feeling very well.  She does not have any known fevers.  She has had some postnasal drip this morning.  She has a little bit of sore throat but says it does not bother her too much although when she looked in her throat this morning she had some white spots on her tonsils so she came into get checked out.  No nausea or vomiting.  No headaches.  No shortness of breath.  No chest congestion.     History reviewed. No pertinent past medical history.  Patient Active Problem List   Diagnosis Date Noted  . Major depressive disorder, single episode, severe without psychotic features (HCC) 01/18/2016    History reviewed. No pertinent surgical history.   OB History    Gravida  1   Para  0   Term  0   Preterm  0   AB  0   Living  0     SAB  0   TAB  0   Ectopic  0   Multiple  0   Live Births               Home Medications    Prior to Admission medications   Medication Sig Start Date End Date Taking? Authorizing Provider  acetaminophen (TYLENOL) 500 MG tablet Take 1 tablet (500 mg total) by mouth every 6 (six) hours as needed. For pain/fever. 01/22/16   Armandina StammerNwoko, Agnes I, NP  cephALEXin (KEFLEX) 500 MG capsule Take 1 capsule (500 mg total) by mouth 4 (four) times daily. 04/05/17   Geoffery Lyonselo, Douglas, MD  citalopram (CELEXA) 10 MG tablet Take 1 tablet (10 mg total) by mouth daily. For depression 01/23/16   Armandina StammerNwoko, Agnes I, NP  hydrOXYzine (ATARAX/VISTARIL) 25 MG tablet Take 1 tablet (25 mg) Q 6 hours as needed: For anxiety 01/22/16   Armandina StammerNwoko, Agnes I, NP  ibuprofen (ADVIL,MOTRIN) 800 MG tablet Take 1 tablet (800 mg total) every  8 (eight) hours as needed by mouth. 11/20/16   Lawyer, Cristal Deerhristopher, PA-C  phenol (CHLORASEPTIC) 1.4 % LIQD Use as directed 1 spray in the mouth or throat as needed for throat irritation / pain. 04/02/17   Caccavale, Sophia, PA-C  predniSONE (DELTASONE) 10 MG tablet Take 2 tablets (20 mg total) by mouth 2 (two) times daily. 04/05/17   Geoffery Lyonselo, Douglas, MD  promethazine-dextromethorphan (PROMETHAZINE-DM) 6.25-15 MG/5ML syrup Take 5 mLs by mouth 4 (four) times daily as needed for cough. 04/02/17   Caccavale, Sophia, PA-C  traZODone (DESYREL) 50 MG tablet Take 1 tablet (50 mg total) by mouth at bedtime as needed for sleep. 01/22/16   Sanjuana KavaNwoko, Agnes I, NP    Family History Family History  Problem Relation Age of Onset  . Breast cancer Mother   . Cancer Mother        lung    Social History Social History   Tobacco Use  . Smoking status: Current Every Day Smoker    Packs/day: 1.00    Types: Cigarettes  . Smokeless tobacco: Never Used  Substance Use Topics  . Alcohol use: Yes  Comment: weekly  . Drug use: No     Allergies   Patient has no known allergies.   Review of Systems Review of Systems  Constitutional: Positive for fatigue. Negative for chills, diaphoresis and fever.  HENT: Positive for congestion, postnasal drip and sore throat. Negative for rhinorrhea and sneezing.   Eyes: Negative.   Respiratory: Negative for cough, chest tightness and shortness of breath.   Cardiovascular: Negative for chest pain and leg swelling.  Gastrointestinal: Negative for abdominal pain, blood in stool, diarrhea, nausea and vomiting.  Genitourinary: Negative for difficulty urinating, flank pain, frequency and hematuria.  Musculoskeletal: Positive for myalgias. Negative for arthralgias and back pain.  Skin: Negative for rash.  Neurological: Negative for dizziness, speech difficulty, weakness, numbness and headaches.     Physical Exam Updated Vital Signs BP 120/86 (BP Location: Left Arm)   Pulse 82    Temp 98.7 F (37.1 C)   Resp 18   Ht 5\' 2"  (1.575 m)   Wt 113.4 kg   SpO2 99%   BMI 45.73 kg/m   Physical Exam  Constitutional: She is oriented to person, place, and time. She appears well-developed and well-nourished.  HENT:  Head: Normocephalic and atraumatic.  Right Ear: Tympanic membrane normal.  Left Ear: Tympanic membrane normal.  Mouth/Throat: Uvula is midline. Posterior oropharyngeal edema present. No tonsillar abscesses.  Mild erythema of the posterior tonsils bilaterally with some mild exudates, uvula is midline, no peritonsillar fullness, no trismus  Eyes: Pupils are equal, round, and reactive to light.  Neck: Normal range of motion. Neck supple.  Cardiovascular: Normal rate, regular rhythm and normal heart sounds.  Pulmonary/Chest: Effort normal and breath sounds normal. No respiratory distress. She has no wheezes. She has no rales. She exhibits no tenderness.  Abdominal: Soft. Bowel sounds are normal. There is no tenderness. There is no rebound and no guarding.  Musculoskeletal: Normal range of motion. She exhibits no edema.  Lymphadenopathy:    She has no cervical adenopathy.  Neurological: She is alert and oriented to person, place, and time.  Skin: Skin is warm and dry. No rash noted.  Psychiatric: She has a normal mood and affect.     ED Treatments / Results  Labs (all labs ordered are listed, but only abnormal results are displayed) Labs Reviewed  GROUP A STREP BY PCR    EKG None  Radiology No results found.  Procedures Procedures (including critical care time)  Medications Ordered in ED Medications  ibuprofen (ADVIL,MOTRIN) tablet 800 mg (800 mg Oral Given 08/25/17 1250)     Initial Impression / Assessment and Plan / ED Course  I have reviewed the triage vital signs and the nursing notes.  Pertinent labs & imaging results that were available during my care of the patient were reviewed by me and considered in my medical decision making (see  chart for details).     Patient presents with URI symptoms and mild sore throat.  Her strep test was negative.  Her lungs are clear without suggestions of pneumonia.  She has no hypoxia.  She had some tachycardia with a fever but this resolved with fever reduction.  She was discharged home in good condition.  Symptomatic care instructions were given.  Final Clinical Impressions(s) / ED Diagnoses   Final diagnoses:  Viral upper respiratory tract infection    ED Discharge Orders    None       Rolan Bucco, MD 08/25/17 1438

## 2017-11-04 ENCOUNTER — Emergency Department (HOSPITAL_BASED_OUTPATIENT_CLINIC_OR_DEPARTMENT_OTHER)
Admission: EM | Admit: 2017-11-04 | Discharge: 2017-11-04 | Disposition: A | Discharge status: Home / Self Care | Payer: Self-pay | Attending: Emergency Medicine | Admitting: Emergency Medicine

## 2017-11-04 ENCOUNTER — Encounter (HOSPITAL_BASED_OUTPATIENT_CLINIC_OR_DEPARTMENT_OTHER): Payer: Self-pay | Admitting: Emergency Medicine

## 2017-11-04 ENCOUNTER — Other Ambulatory Visit: Payer: Self-pay

## 2017-11-04 DIAGNOSIS — B309 Viral conjunctivitis, unspecified: Secondary | ICD-10-CM | POA: Insufficient documentation

## 2017-11-04 DIAGNOSIS — H5789 Other specified disorders of eye and adnexa: Secondary | ICD-10-CM

## 2017-11-04 DIAGNOSIS — F17210 Nicotine dependence, cigarettes, uncomplicated: Secondary | ICD-10-CM | POA: Insufficient documentation

## 2017-11-04 DIAGNOSIS — Z79899 Other long term (current) drug therapy: Secondary | ICD-10-CM | POA: Insufficient documentation

## 2017-11-04 MED ORDER — SULFACETAMIDE SODIUM 10 % OP SOLN: 2.0000 [drp] | OPHTHALMIC | 0 refills | Status: AC

## 2017-11-04 NOTE — ED Triage Notes (Addendum)
Patent reports bilateral eye redness which began yesterday in the left eye.  States this no progressed to both eyes.  Denies blurred vision.

## 2017-11-04 NOTE — Discharge Instructions (Addendum)
If in the next few days you develop thick, goopy green drainage from either eye, or you start waking up with your eyes caked shut from the drainage then please start using your antibiotic eyedrop.  If you do not have these symptoms then this drop will not help.  These get rid of all eye related products the you have used recently including mascara eye liners, false eyelashes, and lid glue.    Please take Ibuprofen (Advil, motrin) and Tylenol (acetaminophen) to relieve your pain.  You may take up to 600 MG (3 pills) of normal strength ibuprofen every 8 hours as needed.  In between doses of ibuprofen you make take tylenol, up to 1,000 mg (two extra strength pills).  Do not take more Awe 3,000 mg tylenol in a 24 hour period.  Please check all medication labels as many medications such as pain and cold medications may contain tylenol.  Do not drink alcohol while taking these medications.  Do not take other NSAID'S while taking ibuprofen (such as aleve or naproxen).  Please take ibuprofen with food to decrease stomach upset.

## 2017-11-04 NOTE — ED Notes (Signed)
Signature pad in room not working. Pt provided verbal understanding of discharge instructions with teach back.  

## 2017-11-04 NOTE — ED Provider Notes (Signed)
[facility] EMERGENCY DEPARTMENT Provider Note   CSN: [id number] Arrival date & time: 11/04/17  1911     History   Chief Complaint Chief Complaint  Patient presents with  . Eye Problem    HPI Carolyn Barajas is a 27 y.o. female who presents today for evaluation of both of her eyes being red.  She reports that yesterday her left eye was red and then when she woke up today her right eye was red also.  She denies any vision changes.  She reports that her boyfriend had similar symptoms approximately 1 week ago.  She denies any discharge.  She is not waking up with her eyes caked shut.  She does not wear any contacts.  She does report that she recently switched which brand of eyeliner she was using.  She denies any concern for having scratched her eyes.  HPI  History reviewed. No pertinent past medical history.  Patient Active Problem List   Diagnosis Date Noted  . Major depressive disorder, single episode, severe without psychotic features (HCC) 01/18/2016    History reviewed. No pertinent surgical history.   OB History    Gravida  1   Para  0   Term  0   Preterm  0   AB  0   Living  0     SAB  0   TAB  0   Ectopic  0   Multiple  0   Live Births               Home Medications    Prior to Admission medications   Medication Sig Start Date End Date Taking? Authorizing Provider  acetaminophen (TYLENOL) 500 MG tablet Take 1 tablet (500 mg total) by mouth every 6 (six) hours as needed. For pain/fever. 01/22/16   Armandina Stammer I, NP  cephALEXin (KEFLEX) 500 MG capsule Take 1 capsule (500 mg total) by mouth 4 (four) times daily. 04/05/17   Geoffery Lyons, MD  citalopram (CELEXA) 10 MG tablet Take 1 tablet (10 mg total) by mouth daily. For depression 01/23/16   Armandina Stammer I, NP  hydrOXYzine (ATARAX/VISTARIL) 25 MG tablet Take 1 tablet (25 mg) Q 6 hours as needed: For anxiety 01/22/16   Armandina Stammer I, NP  ibuprofen (ADVIL,MOTRIN) 800 MG tablet Take 1 tablet (800  mg total) every 8 (eight) hours as needed by mouth. 11/20/16   Lawyer, Cristal Deer, PA-C  phenol (CHLORASEPTIC) 1.4 % LIQD Use as directed 1 spray in the mouth or throat as needed for throat irritation / pain. 04/02/17   Caccavale, Sophia, PA-C  predniSONE (DELTASONE) 10 MG tablet Take 2 tablets (20 mg total) by mouth 2 (two) times daily. 04/05/17   Geoffery Lyons, MD  promethazine-dextromethorphan (PROMETHAZINE-DM) 6.25-15 MG/5ML syrup Take 5 mLs by mouth 4 (four) times daily as needed for cough. 04/02/17   Caccavale, Sophia, PA-C  sulfacetamide (BLEPH-10) 10 % ophthalmic solution Place 2 drops into both eyes every 3 (three) hours while awake for 5 days. 11/04/17 11/09/17  Cristina Gong, PA-C  traZODone (DESYREL) 50 MG tablet Take 1 tablet (50 mg total) by mouth at bedtime as needed for sleep. 01/22/16   Sanjuana Kava, NP    Family History Family History  Problem Relation Age of Onset  . Breast cancer Mother   . Cancer Mother        lung    Social History Social History   Tobacco Use  . Smoking status: Current Every Day Smoker  Packs/day: 1.00    Types: Cigarettes  . Smokeless tobacco: Never Used  Substance Use Topics  . Alcohol use: Yes    Comment: weekly  . Drug use: No     Allergies   Patient has no known allergies.   Review of Systems Review of Systems  Constitutional: Negative for chills and fever.  HENT: Negative for congestion, facial swelling, sinus pressure and sinus pain.   Eyes: Positive for redness. Negative for photophobia, pain, discharge, itching and visual disturbance.  All other systems reviewed and are negative.    Physical Exam Updated Vital Signs BP 139/82   Pulse (!) 115   Temp 98.5 F (36.9 C) (Oral)   Resp 20   Ht 5\' 2"  (1.575 m)   Wt 112.9 kg   SpO2 97%   BMI 45.54 kg/m   Physical Exam  Constitutional: She appears well-developed. No distress.  HENT:  Head: Normocephalic and atraumatic.  Eyes: Pupils are equal, round, and reactive  to light. EOM are normal. Right eye exhibits no discharge. Left eye exhibits no discharge. Right conjunctiva is injected. Right conjunctiva has no hemorrhage. Left conjunctiva is injected. Left conjunctiva has no hemorrhage.  Neck: Normal range of motion.  Neurological: She is alert.  Skin: Skin is warm and dry. She is not diaphoretic.  Psychiatric: She has a normal mood and affect. Her behavior is normal.  Nursing note and vitals reviewed.    ED Treatments / Results  Labs (all labs ordered are listed, but only abnormal results are displayed) Labs Reviewed - No data to display  EKG None  Radiology No results found.  Procedures Procedures (including critical care time)  Medications Ordered in ED Medications - No data to display   Initial Impression / Assessment and Plan / ED Course  I have reviewed the triage vital signs and the nursing notes.  Pertinent labs & imaging results that were available during my care of the patient were reviewed by me and considered in my medical decision making (see chart for details).    Viral conjunctivitis  Patient presentation consistent with viral conjunctivitis.  No purulent discharge. Presentation non-concerning for iritis, bacterial conjunctivitis, corneal abrasions, or HSV.  As it is becoming the weekend patient is given a prescription for antibiotic eyedrops, with instructions to only use them if she develops thick, green drainage or other symptoms consistent with bacterial conjunctivitis.  Personal hygiene and frequent handwashing discussed.  Patient advised to followup with ophthalmologist if symptoms persist or worsen in any way including vision change or purulent discharge.  Patient verbalizes understanding and is agreeable with discharge.   Final Clinical Impressions(s) / ED Diagnoses   Final diagnoses:  Red eyes    ED Discharge Orders         Ordered    sulfacetamide (BLEPH-10) 10 % ophthalmic solution  Every  3 hours while  awake     11/04/17 2039           Cristina Gong, [place] 11/04/17 2047    Loren Racer, MD 11/04/17 2257

## 2019-01-03 ENCOUNTER — Other Ambulatory Visit: Payer: Self-pay

## 2019-01-03 DIAGNOSIS — Z20822 Contact with and (suspected) exposure to covid-19: Secondary | ICD-10-CM

## 2019-01-05 LAB — SPECIMEN STATUS REPORT

## 2019-01-05 LAB — NOVEL CORONAVIRUS, NAA: SARS-CoV-2, NAA: NOT DETECTED

## 2019-01-10 ENCOUNTER — Other Ambulatory Visit: Payer: Self-pay

## 2019-01-10 DIAGNOSIS — Z20822 Contact with and (suspected) exposure to covid-19: Secondary | ICD-10-CM

## 2019-01-12 LAB — NOVEL CORONAVIRUS, NAA: SARS-CoV-2, NAA: NOT DETECTED

## 2022-04-16 ENCOUNTER — Encounter (HOSPITAL_BASED_OUTPATIENT_CLINIC_OR_DEPARTMENT_OTHER): Payer: Self-pay | Admitting: Emergency Medicine

## 2022-04-16 ENCOUNTER — Emergency Department (HOSPITAL_BASED_OUTPATIENT_CLINIC_OR_DEPARTMENT_OTHER)
Admission: EM | Admit: 2022-04-16 | Discharge: 2022-04-16 | Disposition: A | Discharge status: Home / Self Care | Payer: Self-pay | Attending: Emergency Medicine | Admitting: Emergency Medicine

## 2022-04-16 ENCOUNTER — Other Ambulatory Visit: Payer: Self-pay

## 2022-04-16 DIAGNOSIS — L03317 Cellulitis of buttock: Secondary | ICD-10-CM | POA: Insufficient documentation

## 2022-04-16 MED ORDER — DOXYCYCLINE HYCLATE 100 MG PO TABS
100.0000 mg | ORAL_TABLET | Freq: Once | ORAL | Status: AC
  Administered 2022-04-16: 100 mg via ORAL
  Filled 2022-04-16: qty 1

## 2022-04-16 MED ORDER — DOXYCYCLINE HYCLATE 100 MG PO CAPS: 100.0000 mg | ORAL_CAPSULE | Freq: Two times a day (BID) | ORAL | 0 refills | Status: AC

## 2022-04-16 NOTE — ED Triage Notes (Signed)
Pt has a bite on right bottom side of buttock. X 5 days is worse today. Pt states unable to sleep.

## 2022-04-16 NOTE — ED Notes (Signed)
Pt states she has a bump on her right buttocks that popped a few days ago and states she has been having pain and tenderness since.

## 2022-04-16 NOTE — ED Provider Notes (Signed)
[place] [facility] AT [facility] Provider Note   CSN: [id number] Arrival date & time: 04/16/22  0101     History  Chief Complaint  Patient presents with   Insect Bite    Carolyn Barajas is a 32 y.o. female.  The history is provided by the patient.   Carolyn Barajas is a 32 y.o. female who presents to the Emergency Department complaining of spider bite.  She presents to the emergency department for evaluation of presumed site of spider bite that started about 4 days ago.  She has pain and swelling to the right lower buttocks.  She states that the swelling has worsened so she presents for evaluation.  No fevers, nausea, vomiting, diarrhea.  No prior similar symptoms.  She has no known medical problems and takes no routine medications.  Denies any chance of pregnancy.    Home Medications Prior to Admission medications   Medication Sig Start Date End Date Taking? Authorizing Provider  doxycycline (VIBRAMYCIN) 100 MG capsule Take 1 capsule (100 mg total) by mouth 2 (two) times daily. 04/16/22  Yes Tilden Fossa, MD  acetaminophen (TYLENOL) 500 MG tablet Take 1 tablet (500 mg total) by mouth every 6 (six) hours as needed. For pain/fever. 01/22/16   Armandina Stammer I, NP  cephALEXin (KEFLEX) 500 MG capsule Take 1 capsule (500 mg total) by mouth 4 (four) times daily. 04/05/17   Geoffery Lyons, MD  citalopram (CELEXA) 10 MG tablet Take 1 tablet (10 mg total) by mouth daily. For depression 01/23/16   Armandina Stammer I, NP  hydrOXYzine (ATARAX/VISTARIL) 25 MG tablet Take 1 tablet (25 mg) Q 6 hours as needed: For anxiety 01/22/16   Armandina Stammer I, NP  ibuprofen (ADVIL,MOTRIN) 800 MG tablet Take 1 tablet (800 mg total) every 8 (eight) hours as needed by mouth. 11/20/16   Lawyer, Cristal Deer, PA-C  phenol (CHLORASEPTIC) 1.4 % LIQD Use as directed 1 spray in the mouth or throat as needed for throat irritation / pain. 04/02/17   Caccavale, Sophia, PA-C  predniSONE (DELTASONE) 10 MG tablet Take 2  tablets (20 mg total) by mouth 2 (two) times daily. 04/05/17   Geoffery Lyons, MD  promethazine-dextromethorphan (PROMETHAZINE-DM) 6.25-15 MG/5ML syrup Take 5 mLs by mouth 4 (four) times daily as needed for cough. 04/02/17   Caccavale, Sophia, PA-C  traZODone (DESYREL) 50 MG tablet Take 1 tablet (50 mg total) by mouth at bedtime as needed for sleep. 01/22/16   Sanjuana Kava, NP      Allergies    Patient has no known allergies.    Review of Systems   Review of Systems  All other systems reviewed and are negative.   Physical Exam Updated Vital Signs BP (!) 129/92 (BP Location: Right Arm)   Pulse (!) 109   Temp 99.3 F (37.4 C) (Oral)   Resp 20   Ht 5\' 2"  (1.575 m)   Wt 113.4 kg   LMP 03/15/2022   SpO2 99%   BMI 45.73 kg/m  Physical Exam Vitals and nursing note reviewed.  Constitutional:      Appearance: She is well-developed.  HENT:     Head: Normocephalic and atraumatic.  Cardiovascular:     Rate and Rhythm: Normal rate and regular rhythm.  Pulmonary:     Effort: Pulmonary effort is normal. No respiratory distress.  Musculoskeletal:        General: No tenderness.     Comments: The right lateral lower gluteal fold has a 4 x 4  area of erythema and induration without any focal fluctuance.  There is 1 central punctate eschar without any purulence.  Skin:    General: Skin is warm and dry.  Neurological:     Mental Status: She is alert and oriented to person, place, and time.  Psychiatric:        Behavior: Behavior normal.     ED Results / Procedures / Treatments   Labs (all labs ordered are listed, but only abnormal results are displayed) Labs Reviewed - No data to display  EKG None  Radiology No results found.  Procedures Procedures    Medications Ordered in ED Medications  doxycycline (VIBRA-TABS) tablet 100 mg (100 mg Oral Given 04/16/22 0233)    ED Course/ Medical Decision Making/ A&P                             Medical Decision  Making Risk Prescription drug management.   Patient here for evaluation of pain to the right gluteal region.  She is concerned for insect bite but there was no witnessed insect bite.  On examination she has induration but no evidence of abscess.  Will start antibiotic for possible cellulitis.  Discussed with patient home care for cellulitis with return precautions if she develops evidence of a developing abscess.  No evidence of necrotizing soft tissue infection.       Final Clinical Impression(s) / ED Diagnoses Final diagnoses:  Cellulitis of buttock    Rx / DC Orders ED Discharge Orders          Ordered    doxycycline (VIBRAMYCIN) 100 MG capsule  2 times daily        04/16/22 0227              Tilden Fossa, MD 04/16/22 [phone]

## 2023-09-08 ENCOUNTER — Emergency Department (HOSPITAL_BASED_OUTPATIENT_CLINIC_OR_DEPARTMENT_OTHER): Payer: Self-pay

## 2023-09-08 ENCOUNTER — Other Ambulatory Visit: Payer: Self-pay

## 2023-09-08 ENCOUNTER — Encounter (HOSPITAL_BASED_OUTPATIENT_CLINIC_OR_DEPARTMENT_OTHER): Payer: Self-pay

## 2023-09-08 ENCOUNTER — Emergency Department (HOSPITAL_BASED_OUTPATIENT_CLINIC_OR_DEPARTMENT_OTHER)
Admission: EM | Admit: 2023-09-08 | Discharge: 2023-09-08 | Disposition: A | Discharge status: Home / Self Care | Payer: Self-pay | Attending: Emergency Medicine | Admitting: Emergency Medicine

## 2023-09-08 DIAGNOSIS — M79641 Pain in right hand: Secondary | ICD-10-CM | POA: Insufficient documentation

## 2023-09-08 MED ORDER — MELOXICAM 7.5 MG PO TABS: 7.5000 mg | ORAL_TABLET | Freq: Every day | ORAL | 0 refills | Status: AC

## 2023-09-08 MED ORDER — LIDOCAINE 5 % EX PTCH
1.0000 | MEDICATED_PATCH | CUTANEOUS | Status: DC
  Administered 2023-09-08: 1 via TRANSDERMAL
  Filled 2023-09-08: qty 1

## 2023-09-08 MED ORDER — NAPROXEN 250 MG PO TABS
500.0000 mg | ORAL_TABLET | Freq: Once | ORAL | Status: AC
  Administered 2023-09-08: 500 mg via ORAL
  Filled 2023-09-08: qty 2

## 2023-09-08 MED ORDER — ACETAMINOPHEN 500 MG PO TABS
1000.0000 mg | ORAL_TABLET | Freq: Once | ORAL | Status: AC
  Administered 2023-09-08: 1000 mg via ORAL
  Filled 2023-09-08: qty 2

## 2023-09-08 NOTE — ED Triage Notes (Signed)
 Pt. Reports 2 nights ago pt. Noticed right hand pain. States it feels stiff and pain. Pt. Took advil  yesterday. Pt. States she can't sleep or work d/t pain

## 2023-09-08 NOTE — ED Provider Notes (Signed)
 [place] [facility] AT [facility] Provider Note   CSN: [id number] Arrival date & time: 09/08/23  0447     Patient presents with: Hand Pain   Carolyn Barajas is a 33 y.o. female.   The history is provided by the patient.  Hand Pain This is a new problem. The current episode started more [place] 2 days ago. The problem occurs constantly. The problem has been gradually worsening. Nothing aggravates the symptoms. Nothing relieves the symptoms. Treatments tried: one dose of ibuprofen . The treatment provided no relief.  Patient with hand pain.  No trauma. Feels stiff and swollen.  No change in color.       Prior to Admission medications   Medication Sig Start Date End Date Taking? Authorizing Provider  acetaminophen  (TYLENOL ) 500 MG tablet Take 1 tablet (500 mg total) by mouth every 6 (six) hours as needed. For pain/fever. 01/22/16   Collene Gouge I, NP  cephALEXin  (KEFLEX ) 500 MG capsule Take 1 capsule (500 mg total) by mouth 4 (four) times daily. 04/05/17   Geroldine Berg, MD  citalopram  (CELEXA ) 10 MG tablet Take 1 tablet (10 mg total) by mouth daily. For depression 01/23/16   Collene Gouge I, NP  doxycycline  (VIBRAMYCIN ) 100 MG capsule Take 1 capsule (100 mg total) by mouth 2 (two) times daily. 04/16/22   Griselda Norris, MD  hydrOXYzine  (ATARAX /VISTARIL ) 25 MG tablet Take 1 tablet (25 mg) Q 6 hours as needed: For anxiety 01/22/16   Collene Gouge I, NP  ibuprofen  (ADVIL ,MOTRIN ) 800 MG tablet Take 1 tablet (800 mg total) every 8 (eight) hours as needed by mouth. 11/20/16   Lawyer, Lonni, PA-C  phenol (CHLORASEPTIC) 1.4 % LIQD Use as directed 1 spray in the mouth or throat as needed for throat irritation / pain. 04/02/17   Caccavale, Sophia, PA-C  predniSONE  (DELTASONE ) 10 MG tablet Take 2 tablets (20 mg total) by mouth 2 (two) times daily. 04/05/17   Geroldine Berg, MD  promethazine -dextromethorphan (PROMETHAZINE -DM) 6.25-15 MG/5ML syrup Take 5 mLs by mouth 4 (four) times daily as  needed for cough. 04/02/17   Caccavale, Sophia, PA-C  traZODone  (DESYREL ) 50 MG tablet Take 1 tablet (50 mg total) by mouth at bedtime as needed for sleep. 01/22/16   Collene Gouge FERNS, NP    Allergies: Patient has no known allergies.    Review of Systems  Updated Vital Signs BP (!) 144/104   Pulse (!) 108   Temp 98.2 F (36.8 C) (Oral)   Resp 18   LMP 08/19/2023   SpO2 99%   Physical Exam Vitals and nursing note reviewed. Exam conducted with a chaperone present.  Constitutional:      General: She is not in acute distress.    Appearance: Normal appearance. She is well-developed.  HENT:     Head: Normocephalic and atraumatic.     Nose: Nose normal.  Eyes:     Extraocular Movements: Extraocular movements intact.  Cardiovascular:     Rate and Rhythm: Normal rate and regular rhythm.     Pulses: Normal pulses.     Heart sounds: Normal heart sounds.  Pulmonary:     Effort: Pulmonary effort is normal. No respiratory distress.     Breath sounds: Normal breath sounds.  Abdominal:     General: Bowel sounds are normal. There is no distension.     Palpations: Abdomen is soft.     Tenderness: There is no abdominal tenderness. There is no guarding or rebound.  Musculoskeletal:  General: Normal range of motion.     Right wrist: Normal. No bony tenderness, snuff box tenderness or crepitus. Normal pulse.     Left wrist: Normal.     Right hand: No deformity, lacerations or bony tenderness. Normal range of motion. Normal strength. Normal sensation. Normal capillary refill. Normal pulse.     Cervical back: Neck supple.     Comments: Right hand NVI  Skin:    General: Skin is warm and dry.     Capillary Refill: Capillary refill takes less [place] 2 seconds.     Coloration: Skin is not jaundiced.     Findings: No bruising, erythema, lesion or rash.  Neurological:     General: No focal deficit present.     Mental Status: She is alert.     Deep Tendon Reflexes: Reflexes normal.   Psychiatric:        Mood and Affect: Mood normal.     (all labs ordered are listed, but only abnormal results are displayed) Labs Reviewed - No data to display  EKG: None  Radiology: No results found.   Procedures   Medications Ordered in the ED  acetaminophen  (TYLENOL ) tablet 1,000 mg (has no administration in time range)  naproxen  (NAPROSYN ) tablet 500 mg (has no administration in time range)  lidocaine  (LIDODERM ) 5 % 1 patch (has no administration in time range)                                    Medical Decision Making Amount and/or Complexity of Data Reviewed External Data Reviewed: notes.    Details: Previous notes reviewed  Radiology: ordered and independent interpretation performed.    Details: No fractures   Risk OTC drugs. Prescription drug management. Risk Details: Well appearing.  FROM of the right hand.  Right hand NVI.  Like strain or overuse injury.  Follow up with hand if symptoms persist.  Stable for discharge.       Final diagnoses:  None   No signs of systemic illness or infection. The patient is nontoxic-appearing on exam and vital signs are within normal limits. I have reviewed the triage vital signs and the nursing notes. Pertinent labs & imaging results that were available during my care of the patient were reviewed by me and considered in my medical decision making (see chart for details). After history, exam, and medical workup I feel the patient has been appropriately medically screened and is safe for discharge home. Pertinent diagnoses were discussed with the patient. Patient was given return precautions.   ED Discharge Orders    ED Discharge Orders     None          Deosha Werden, MD 09/08/23 [id number]
